# Patient Record
Sex: Female | Born: 1940 | Race: Black or African American | Hispanic: No | Marital: Married | Smoking: Never smoker
Health system: Southern US, Community
[De-identification: ages and names within clinical notes are randomized; demographics above are authoritative.]

## PROBLEM LIST (undated history)

## (undated) DIAGNOSIS — R945 Abnormal results of liver function studies: Secondary | ICD-10-CM

## (undated) DIAGNOSIS — K2970 Gastritis, unspecified, without bleeding: Secondary | ICD-10-CM

## (undated) DIAGNOSIS — E039 Hypothyroidism, unspecified: Secondary | ICD-10-CM

## (undated) DIAGNOSIS — B9681 Helicobacter pylori [H. pylori] as the cause of diseases classified elsewhere: Secondary | ICD-10-CM

## (undated) DIAGNOSIS — M1990 Unspecified osteoarthritis, unspecified site: Secondary | ICD-10-CM

## (undated) DIAGNOSIS — R7989 Other specified abnormal findings of blood chemistry: Secondary | ICD-10-CM

## (undated) DIAGNOSIS — E785 Hyperlipidemia, unspecified: Secondary | ICD-10-CM

## (undated) DIAGNOSIS — G4730 Sleep apnea, unspecified: Secondary | ICD-10-CM

## (undated) DIAGNOSIS — K589 Irritable bowel syndrome without diarrhea: Secondary | ICD-10-CM

## (undated) DIAGNOSIS — K449 Diaphragmatic hernia without obstruction or gangrene: Secondary | ICD-10-CM

## (undated) DIAGNOSIS — F0390 Unspecified dementia without behavioral disturbance: Secondary | ICD-10-CM

## (undated) DIAGNOSIS — G5600 Carpal tunnel syndrome, unspecified upper limb: Secondary | ICD-10-CM

## (undated) HISTORY — DX: Gastritis, unspecified, without bleeding: K29.70

## (undated) HISTORY — DX: Sleep apnea, unspecified: G47.30

## (undated) HISTORY — DX: Diaphragmatic hernia without obstruction or gangrene: K44.9

## (undated) HISTORY — DX: Abnormal results of liver function studies: R94.5

## (undated) HISTORY — DX: Unspecified dementia, unspecified severity, without behavioral disturbance, psychotic disturbance, mood disturbance, and anxiety: F03.90

## (undated) HISTORY — DX: Hyperlipidemia, unspecified: E78.5

## (undated) HISTORY — DX: Irritable bowel syndrome, unspecified: K58.9

## (undated) HISTORY — PX: THYROID SURGERY: SHX805

## (undated) HISTORY — DX: Hypothyroidism, unspecified: E03.9

## (undated) HISTORY — DX: Other specified abnormal findings of blood chemistry: R79.89

## (undated) HISTORY — DX: Unspecified osteoarthritis, unspecified site: M19.90

## (undated) HISTORY — PX: TONSILLECTOMY: SHX5217

## (undated) HISTORY — DX: Carpal tunnel syndrome, unspecified upper limb: G56.00

## (undated) HISTORY — DX: Helicobacter pylori (H. pylori) as the cause of diseases classified elsewhere: B96.81

## (undated) HISTORY — PX: ABDOMINAL HYSTERECTOMY: SHX81

## (undated) HISTORY — PX: BREAST LUMPECTOMY: SHX2

## (undated) HISTORY — PX: WRIST GANGLION EXCISION: SHX840

## (undated) HISTORY — PX: OTHER SURGICAL HISTORY: SHX169

---

## 1992-10-26 HISTORY — PX: CARPAL TUNNEL RELEASE: SHX101

## 2009-08-14 ENCOUNTER — Ambulatory Visit: Payer: Self-pay | Admitting: Family Medicine

## 2009-08-14 ENCOUNTER — Ambulatory Visit: Payer: Self-pay | Admitting: Obstetrics & Gynecology

## 2009-08-14 DIAGNOSIS — F0390 Unspecified dementia without behavioral disturbance: Secondary | ICD-10-CM

## 2009-08-14 DIAGNOSIS — R109 Unspecified abdominal pain: Secondary | ICD-10-CM | POA: Insufficient documentation

## 2009-08-14 DIAGNOSIS — J45909 Unspecified asthma, uncomplicated: Secondary | ICD-10-CM | POA: Insufficient documentation

## 2009-08-14 DIAGNOSIS — K5900 Constipation, unspecified: Secondary | ICD-10-CM | POA: Insufficient documentation

## 2009-08-15 ENCOUNTER — Encounter: Payer: Self-pay | Admitting: Family Medicine

## 2009-08-15 ENCOUNTER — Encounter: Payer: Self-pay | Admitting: Obstetrics & Gynecology

## 2009-08-31 ENCOUNTER — Ambulatory Visit: Payer: Self-pay | Admitting: Diagnostic Radiology

## 2009-08-31 ENCOUNTER — Emergency Department (HOSPITAL_BASED_OUTPATIENT_CLINIC_OR_DEPARTMENT_OTHER): Admission: EM | Admit: 2009-08-31 | Discharge: 2009-08-31 | Payer: Self-pay | Admitting: Emergency Medicine

## 2009-11-25 ENCOUNTER — Ambulatory Visit: Payer: Self-pay | Admitting: Radiology

## 2009-11-25 ENCOUNTER — Emergency Department (HOSPITAL_BASED_OUTPATIENT_CLINIC_OR_DEPARTMENT_OTHER): Admission: EM | Admit: 2009-11-25 | Discharge: 2009-11-25 | Payer: Self-pay | Admitting: Emergency Medicine

## 2010-02-13 ENCOUNTER — Inpatient Hospital Stay (HOSPITAL_COMMUNITY): Admission: AD | Admit: 2010-02-13 | Discharge: 2010-02-13 | Payer: Self-pay | Admitting: Obstetrics & Gynecology

## 2010-06-26 HISTORY — PX: REPLACEMENT TOTAL KNEE: SUR1224

## 2010-11-24 ENCOUNTER — Encounter (INDEPENDENT_AMBULATORY_CARE_PROVIDER_SITE_OTHER): Payer: Self-pay | Admitting: *Deleted

## 2010-12-03 NOTE — Letter (Signed)
Summary: New Patient letter  [facility]  [address] [place], [place] [address]   Phone: [phone]  Fax: [phone]       11/24/2010 MRN: [id number]  [facility] [address] [place], [place]  [address]  Dear Ms. Palinkas,  Welcome to the Gastroenterology Division at [facility].    You are scheduled to see Dr.  Leone Payor on 12-31-10 at 2:00P.M. on the 3rd floor at [facility], [address]  We ask that you try to arrive at our office 15 minutes prior to your appointment time to allow for check-in.  We would like you to complete the enclosed self-administered evaluation form prior to your visit and bring it with you on the day of your appointment.  We will review it with you.  Also, please bring a complete list of all your medications or, if you prefer, bring the medication bottles and we will list them.  Please bring your insurance card so that we may make a copy of it.  If your insurance requires a referral to see a specialist, please bring your referral form from your primary care physician.  Co-payments are due at the time of your visit and may be paid by cash, check or credit card.     Your office visit will consist of a consult with your physician (includes a physical exam), any laboratory testing he/she may order, scheduling of any necessary diagnostic testing (e.g. x-ray, ultrasound, CT-scan), and scheduling of a procedure (e.g. Endoscopy, Colonoscopy) if required.  Please allow enough time on your schedule to allow for any/all of these possibilities.    If you cannot keep your appointment, please call [phone] to cancel or reschedule prior to your appointment date.  This allows [place] the opportunity to schedule an appointment for another patient in need of care.  If you do not cancel or reschedule by 5 p.m. the business day prior to your appointment date, you will be charged a $50.00 late cancellation/no-show fee.    Thank you for choosing   Gastroenterology for your medical needs.  We appreciate the opportunity to care for you.  Please visit [place] at our website  to learn more about our practice.                     Sincerely,                                                             The Gastroenterology Division

## 2010-12-09 ENCOUNTER — Emergency Department (HOSPITAL_BASED_OUTPATIENT_CLINIC_OR_DEPARTMENT_OTHER)
Admission: EM | Admit: 2010-12-09 | Discharge: 2010-12-09 | Disposition: A | Discharge status: Home / Self Care | Payer: Medicare PPO | Attending: Emergency Medicine | Admitting: Emergency Medicine

## 2010-12-09 DIAGNOSIS — E039 Hypothyroidism, unspecified: Secondary | ICD-10-CM | POA: Insufficient documentation

## 2010-12-09 DIAGNOSIS — Z79899 Other long term (current) drug therapy: Secondary | ICD-10-CM | POA: Insufficient documentation

## 2010-12-09 DIAGNOSIS — G4730 Sleep apnea, unspecified: Secondary | ICD-10-CM | POA: Insufficient documentation

## 2010-12-09 DIAGNOSIS — R112 Nausea with vomiting, unspecified: Secondary | ICD-10-CM | POA: Insufficient documentation

## 2010-12-09 DIAGNOSIS — F0390 Unspecified dementia without behavioral disturbance: Secondary | ICD-10-CM | POA: Insufficient documentation

## 2010-12-09 DIAGNOSIS — R197 Diarrhea, unspecified: Secondary | ICD-10-CM | POA: Insufficient documentation

## 2010-12-09 DIAGNOSIS — E7800 Pure hypercholesterolemia, unspecified: Secondary | ICD-10-CM | POA: Insufficient documentation

## 2010-12-09 DIAGNOSIS — I10 Essential (primary) hypertension: Secondary | ICD-10-CM | POA: Insufficient documentation

## 2010-12-09 DIAGNOSIS — J45909 Unspecified asthma, uncomplicated: Secondary | ICD-10-CM | POA: Insufficient documentation

## 2010-12-09 LAB — COMPREHENSIVE METABOLIC PANEL
ALT: 43 U/L — ABNORMAL HIGH (ref 0–35)
AST: 48 U/L — ABNORMAL HIGH (ref 0–37)
CO2: 28 mEq/L (ref 19–32)
Chloride: 107 mEq/L (ref 96–112)
GFR calc Af Amer: >60 mL/min (ref >60)
GFR calc non Af Amer: >60 mL/min (ref >60)
Glucose, Bld: 85 mg/dL (ref 70–99)
Sodium: 147 mEq/L — ABNORMAL HIGH (ref 135–145)
Total Bilirubin: 1.2 mg/dL (ref 0.3–1.2)

## 2010-12-09 LAB — URINALYSIS, ROUTINE W REFLEX MICROSCOPIC
Bilirubin Urine: NEGATIVE
Hgb urine dipstick: NEGATIVE
Specific Gravity, Urine: 1.036 — ABNORMAL HIGH (ref 1.005–1.030)
Urine Glucose, Fasting: NEGATIVE mg/dL
Urobilinogen, UA: 1 mg/dL (ref 0.0–1.0)
pH: 5.5 (ref 5.0–8.0)

## 2010-12-09 LAB — DIFFERENTIAL
Basophils Relative: 1 % (ref 0–1)
Lymphocytes Relative: 21 % (ref 12–46)
Monocytes Absolute: 0.7 10*3/uL (ref 0.1–1.0)
Monocytes Relative: 11 % (ref 3–12)
Neutro Abs: 4.2 10*3/uL (ref 1.7–7.7)
Neutrophils Relative %: 66 % (ref 43–77)

## 2010-12-09 LAB — CBC
HCT: 36.5 % (ref 36.0–46.0)
Hemoglobin: 11.9 g/dL — ABNORMAL LOW (ref 12.0–15.0)
MCH: 27.9 pg (ref 26.0–34.0)
MCHC: 32.6 g/dL (ref 30.0–36.0)
RBC: 4.26 MIL/uL (ref 3.87–5.11)

## 2010-12-09 LAB — LIPASE, BLOOD: Lipase: 193 U/L (ref 23–300)

## 2010-12-18 ENCOUNTER — Emergency Department (HOSPITAL_COMMUNITY)
Admission: EM | Admit: 2010-12-18 | Discharge: 2010-12-18 | Disposition: A | Discharge status: Home / Self Care | Payer: Medicare PPO | Attending: Emergency Medicine | Admitting: Emergency Medicine

## 2010-12-18 ENCOUNTER — Encounter (INDEPENDENT_AMBULATORY_CARE_PROVIDER_SITE_OTHER): Payer: Self-pay | Admitting: *Deleted

## 2010-12-18 ENCOUNTER — Emergency Department (HOSPITAL_COMMUNITY): Payer: Medicare PPO

## 2010-12-18 DIAGNOSIS — F0390 Unspecified dementia without behavioral disturbance: Secondary | ICD-10-CM | POA: Insufficient documentation

## 2010-12-18 DIAGNOSIS — E039 Hypothyroidism, unspecified: Secondary | ICD-10-CM | POA: Insufficient documentation

## 2010-12-18 DIAGNOSIS — R109 Unspecified abdominal pain: Secondary | ICD-10-CM | POA: Insufficient documentation

## 2010-12-18 DIAGNOSIS — K589 Irritable bowel syndrome without diarrhea: Secondary | ICD-10-CM | POA: Insufficient documentation

## 2010-12-18 LAB — COMPREHENSIVE METABOLIC PANEL
ALT: 27 U/L (ref 0–35)
AST: 46 U/L — ABNORMAL HIGH (ref 0–37)
Albumin: 4.4 g/dL (ref 3.5–5.2)
Calcium: 10.1 mg/dL (ref 8.4–10.5)
GFR calc Af Amer: >60 mL/min (ref >60)
Glucose, Bld: 77 mg/dL (ref 70–99)
Sodium: 140 mEq/L (ref 135–145)
Total Protein: 7.5 g/dL (ref 6.0–8.3)

## 2010-12-18 LAB — DIFFERENTIAL
Basophils Absolute: 0 10*3/uL (ref 0.0–0.1)
Basophils Relative: 0 % (ref 0–1)
Eosinophils Absolute: 0.2 10*3/uL (ref 0.0–0.7)
Eosinophils Relative: 2 % (ref 0–5)
Monocytes Absolute: 0.3 10*3/uL (ref 0.1–1.0)

## 2010-12-18 LAB — URINALYSIS, ROUTINE W REFLEX MICROSCOPIC
Hgb urine dipstick: NEGATIVE
Ketones, ur: NEGATIVE mg/dL
Urine Glucose, Fasting: NEGATIVE mg/dL
pH: 6 (ref 5.0–8.0)

## 2010-12-18 LAB — CBC
MCHC: 31.1 g/dL (ref 30.0–36.0)
Platelets: 280 10*3/uL (ref 150–400)
RDW: 15 % (ref 11.5–15.5)
WBC: 6.6 10*3/uL (ref 4.0–10.5)

## 2010-12-18 MED ORDER — IOHEXOL 300 MG/ML  SOLN: 100.0000 mL | Freq: Once | INTRAMUSCULAR | Status: DC | PRN

## 2010-12-31 ENCOUNTER — Encounter: Payer: Self-pay | Admitting: Internal Medicine

## 2010-12-31 ENCOUNTER — Ambulatory Visit (INDEPENDENT_AMBULATORY_CARE_PROVIDER_SITE_OTHER): Payer: Medicare PPO | Admitting: Internal Medicine

## 2010-12-31 ENCOUNTER — Encounter (INDEPENDENT_AMBULATORY_CARE_PROVIDER_SITE_OTHER): Payer: Self-pay | Admitting: *Deleted

## 2010-12-31 DIAGNOSIS — R109 Unspecified abdominal pain: Secondary | ICD-10-CM | POA: Insufficient documentation

## 2010-12-31 DIAGNOSIS — R634 Abnormal weight loss: Secondary | ICD-10-CM | POA: Insufficient documentation

## 2010-12-31 DIAGNOSIS — R112 Nausea with vomiting, unspecified: Secondary | ICD-10-CM | POA: Insufficient documentation

## 2011-01-01 ENCOUNTER — Encounter: Payer: Self-pay | Admitting: Internal Medicine

## 2011-01-01 ENCOUNTER — Other Ambulatory Visit: Payer: Self-pay | Admitting: Internal Medicine

## 2011-01-01 ENCOUNTER — Other Ambulatory Visit: Payer: Medicare PPO

## 2011-01-01 ENCOUNTER — Other Ambulatory Visit (AMBULATORY_SURGERY_CENTER): Payer: Medicare PPO | Admitting: Internal Medicine

## 2011-01-01 DIAGNOSIS — R740 Nonspecific elevation of levels of transaminase and lactic acid dehydrogenase [LDH]: Secondary | ICD-10-CM

## 2011-01-01 DIAGNOSIS — R112 Nausea with vomiting, unspecified: Secondary | ICD-10-CM

## 2011-01-01 DIAGNOSIS — A048 Other specified bacterial intestinal infections: Secondary | ICD-10-CM

## 2011-01-01 DIAGNOSIS — K2940 Chronic atrophic gastritis without bleeding: Secondary | ICD-10-CM

## 2011-01-01 DIAGNOSIS — R634 Abnormal weight loss: Secondary | ICD-10-CM

## 2011-01-01 DIAGNOSIS — R933 Abnormal findings on diagnostic imaging of other parts of digestive tract: Secondary | ICD-10-CM

## 2011-01-01 DIAGNOSIS — R109 Unspecified abdominal pain: Secondary | ICD-10-CM

## 2011-01-01 LAB — HEPATIC FUNCTION PANEL
AST: 39 U/L — ABNORMAL HIGH (ref 0–37)
Alkaline Phosphatase: 78 U/L (ref 39–117)
Total Protein: 6.8 g/dL (ref 6.0–8.3)

## 2011-01-05 ENCOUNTER — Encounter: Payer: Self-pay | Admitting: Internal Medicine

## 2011-01-06 ENCOUNTER — Telehealth: Payer: Self-pay | Admitting: Internal Medicine

## 2011-01-06 NOTE — Miscellaneous (Signed)
Summary: Orders Update  Clinical Lists Changes  Problems: Added new problem of TRANSAMINASES, SERUM, ELEVATED (ICD-790.4) Orders: Added new Test order of TLB-Hepatic/Liver Function Pnl (80076-HEPATIC) - Signed  Appended Document: Orders Update    Clinical Lists Changes  Orders: Added new Test order of TLB-Hepatic/Liver Function Pnl (80076-HEPATIC) - Signed

## 2011-01-06 NOTE — Assessment & Plan Note (Signed)
Summary: DIARRHEA//SCH'D W/PT//MEDLIST//CX POLICY ADVISED//GI HX 10+ Y...   History of Present Illness Visit Type: Initial Visit Primary GI MD: Stan Head MD [facility] Primary Provider: Tracey Harries, MD Chief Complaint: Abdominal pain. History of Present Illness:   Patients husband states that Kelsey Norris has generalized abdominal pain and vomiting which is not constant. She has been seen recently in the ER, with out an answer to the abdominal pain. She was having diarrhea but it resolved. These symptoms started about 4-5 weeks ago.   Husband provides most of hx due to her dementia. She started with diarrhea and nausea and vomiting. On lactulose for years and that treats constipation. Stools better but not all the way back to where they were.  Now she has continued to have abdominal pain and nuasea and vomiting. It is generally post-prandial, has vomited a couple of tinmes in past week. Partially digested food. she has had 2 ED visits and a CT, labs alk phos elevated. Treated with hydrocodone. Cardiologist stopped metoprolol and losartan which helped improve asthma. enablex is new in last 2 months has held x 4 days and no different. EGD 2009 esophagitis, gastritis ([place]) colonoscopy > 10 years ago ook   GI Review of Systems    Reports abdominal pain, vomiting, and  weight loss.     Location of  Abdominal pain: generalized. Weight loss of 20+ pounds over 1 year.   Denies acid reflux, belching, bloating, chest pain, dysphagia with liquids, dysphagia with solids, heartburn, loss of appetite, nausea, vomiting blood, and  weight gain.        Denies anal fissure, black tarry stools, change in bowel habit, constipation, diarrhea, diverticulosis, fecal incontinence, heme positive stool, hemorrhoids, irritable bowel syndrome, jaundice, light color stool, liver problems, rectal bleeding, and  rectal pain.    Current Medications (verified): 1)  Omeprazole 20 Mg Cpdr (Omeprazole) .... Take One By Mouth  Once Daily 2)  Levoxyl 88 Mcg Tabs (Levothyroxine Sodium) .... Take One By Mouth Once Daily 3)  Advair Hfa 230-21 Mcg/act Aero (Fluticasone-Salmeterol) .... Use Two Times A Day 4)  Singulair 10 Mg Tabs (Montelukast Sodium) .... Take One By Mouth Once Daily 5)  Aricept 5 Mg Tabs (Donepezil Hcl) .... Take One By Mouth Once Daily 6)  Aspirin 81 Mg Chew (Aspirin) .... Take One By Mouth Once Daily 7)  Vitamin B-12 100 Mcg Tabs (Cyanocobalamin) .... Take One By Mouth Once Daily 8)  Generlac 10 Gm/35ml Soln (Lactulose Encephalopathy) .... 2 Tbs By Mouth Twice A Day 9)  Folbic 2.5-25-2 Mg Tabs (Fa-Pyridoxine-Cyancobalamin) .... Take One By Mouth Once Daily 10)  Multivitamins  Tabs (Multiple Vitamin) .... Take One By Mouth Once Daily 11)  Vitamin D 2000 Unit Tabs (Cholecalciferol) .... Take One By Mouth Once Daily 12)  Osteo Bi-Flex Regular Strength 250-200 Mg Tabs (Glucosamine-Chondroitin) .... Take Four By Mouth Once Daily 13)  Pravachol 40 Mg Tabs (Pravastatin Sodium) .... Take One By Mouth Once Daily 14)  Potassium Gluconate 595 Mg Tabs (Potassium Gluconate) .... Take One By Mouth Once Daily 15)  Enablex 7.5 Mg Xr24h-Tab (Darifenacin Hydrobromide) .... Take One By Mouth Once Daily 16)  Vitamin C 1000 Mg Tabs (Ascorbic Acid) .... Take One By Mouth Once Daily 17)  Hydrocodone-Acetaminophen 5-325 Mg Tabs (Hydrocodone-Acetaminophen) .... Take One By Mouth Every 4-6 Hours 18)  Cvs Non-Aspirin Extra Strength 500 Mg Tabs (Acetaminophen) .... Take 2 Capsules Every 4-6 Hours  Allergies (verified): No Known Drug Allergies  Past History:  Past Medical History: Asthma  Dementia Hyperlipidemia Hypothyroidism Sleep Apnea Arthritis Irritable Bowel Syndrome  Past Surgical History: Ear Lymphectomy Heart Cath Carpal tunnel release bilat 1994 Hysterectomy Tonsillectomy Thyroid Surgery Ganglion cyst removed left wrist Lumpectomy left breast right knee replacement 06/2010  Family History: No FH of  Colon Cancer: Family History of Diabetes: sister  Social History: Non-smoker No ETOH No Drugs Retired Tree surgeon Daily Caffeine Use one per week  Review of Systems       The Kelsey Norris complains of arthritis/joint pain, back pain, headaches-new, urination - excessive, and urine leakage.         All other ROS negative except as per HPI.   Vital Signs:  Kelsey Norris profile:   70 year old female Height:      61 inches Weight:      137.2 pounds BMI:     26.02 Pulse rate:   74 / minute Pulse rhythm:   regular BP sitting:   124 / 68  (left arm) Cuff size:   regular  Vitals Entered By: Harlow Mares CMA Duncan Dull) (December 31, 2010 2:40 PM)  Physical Exam  General:  Well developed, well nourished, no acute distress. Eyes:  bilat arcus PEERLA anicteric Mouth:  dentures otherwise clear Neck:  Supple; no masses or thyromegaly. Lungs:  coarse breath sounds fair to good air mvt Heart:  Regular rate and rhythm; no murmurs, rubs,  or bruits. Abdomen:  soft and nontender BS+ no bruits no mass, hernia no HSM Extremities:  no LE edema Cervical Nodes:  No significant cervical or supraclavicular adenopathy.  Psych:  appropriate affect mental status nt tested   Impression & Recommendations:  Problem # 1:  ABDOMINAL PAIN, UPPER (ICD-789.09) Assessment New Several week history. A bit hard to sort out with dementia. initially nausea, vomiting and diarrhea, too. ? functional. She has lost weiht but that can happen with dementia. CT abd/pelvis unrevealing as are labs except for mild increases in alk phos, transaminases. Does still have gallbladder. She also has a hx of IBS. Note that medical hx husband brings shows prior normal HIDA after abnormal gallbladder xray in 1990's, US liver 1998, and problems with weight loss and dysphagia in past also (around tme of EGd with esophagitis and gastritis 2009. will start with EGD to look for possible causes there. If unrevealing then an abdominal  ultrasound next, I think. Risks, benefits,and indications of endoscopic procedure(s) were reviewed with the Kelsey Norris and all questions answered.  Problem # 2:  NAUSEA AND VOMITING (ICD-787.01) Assessment: New Not much vomitng now.  Orders: EGD (EGD)  Problem # 3:  WEIGHT LOSS (ICD-783.21) Assessment: New ? how much from dementia vs. GI sxs  Kelsey Norris Instructions: 1)  [facility] Kelsey Norris Information Guide given to Kelsey Norris.  2)  Upper Endoscopy brochure given.  3)  Your procedure has been scheduled for 01/01/2011, please follow the seperate instructions.  4)  The medication list was reviewed and reconciled.  All changed / newly prescribed medications were explained.  A complete medication list was provided to the Kelsey Norris / caregiver. cc: Dr. Everlene Other

## 2011-01-06 NOTE — Procedures (Addendum)
Summary: Upper Endoscopy  Patient: Kelsey Norris Note: All result statuses are Final unless otherwise noted.  Tests: (1) Upper Endoscopy (EGD)   EGD Upper Endoscopy       DONE     [facility]     [address]     [place], [place]  [address]          ENDOSCOPY PROCEDURE REPORT          PATIENT:  Kelsey Norris, Kelsey Norris  MR#:  [id number]     BIRTHDATE:  Feb 18, 1941, 69 yrs. old  GENDER:  female          ENDOSCOPIST:  Iva Boop, MD, [facility]     Referred by:  Tracey Harries, M.D.          PROCEDURE DATE:  01/01/2011     PROCEDURE:  EGD with biopsy, [address]     ASA CLASS:  Class II     INDICATIONS:  nausea and vomiting, abdominal pain, weight loss          MEDICATIONS:   Fentanyl 25 mcg IV, Versed 3 mg IV     TOPICAL ANESTHETIC:  Exactacain Spray          DESCRIPTION OF PROCEDURE:   After the risks benefits and     alternatives of the procedure were thoroughly explained, informed     consent was obtained.  The [facility] [id number] endoscope was     introduced through the mouth and advanced to the second portion of     the duodenum, without limitations.  The instrument was slowly     withdrawn as the mucosa was fully examined.     <<PROCEDUREIMAGES>>          Abnormal appearing mucosa in the fundus. Red spots seen and     slightly irregular mucosa. ? gastritis. Multiple biopsies were     obtained and sent to pathology.  Otherwise the examination was     normal. There was some bluish discoloration in one side of distal     esophagus that is non-specific, ? prominent venous structures but     not a varix.    Retroflexed views revealed Retroflexion exam     demonstrated findings as previously described.    The scope was     then withdrawn from the patient and the procedure completed.          COMPLICATIONS:  None          ENDOSCOPIC IMPRESSION:     1) Abnormal mucosa in the fundus, ? gastritis - biopsied     2) Otherwise normal examination     RECOMMENDATIONS:     1)  Await pathology results     2) Hepatic function panel today - office has ordered labs.     3) Consider [place] depending upon bx results and labs. she has a     long history of functional GI complaints, multiple medications and     dementia that create multiple possible causes of her problems.          Iva Boop, MD, [facility]          CC:  Tracey Harries, MD and The Patient          n.     eSIGNED:   Iva Boop at 01/01/2011 12:08 PM          Licht, [place], [phone]  Note: An exclamation mark (!) indicates a result that was not dispersed  into the flowsheet. Document Creation Date: 01/01/2011 12:08 PM _______________________________________________________________________  (1) Order result status: Final Collection or observation date-time: 01/01/2011 11:55 Requested date-time:  Receipt date-time:  Reported date-time:  Referring Physician:   Ordering Physician: Stan Head [phone]) Specimen Source:  Source: Launa Grill Order Number: [id number] Lab site:

## 2011-01-06 NOTE — Letter (Signed)
Summary: EGD Instructions  [facility]  [address] [place], [place] [address]   Phone: [phone]  Fax: [phone]       [facility]    [birth year]    MRN: [id number]       Procedure Day /Date: Thursday 01/01/2011     Arrival Time:  9:30am     Procedure Time: 10:30am     Location of Procedure:                    [facility] (4th Floor)   PREPARATION FOR ENDOSCOPY   On 01/01/2011 THE DAY OF THE PROCEDURE:  1.   No solid foods, milk or milk products are allowed after midnight the night before your procedure.  2.   Do not drink anything colored red or purple.  Avoid juices with pulp.  No orange juice.  3.  You may drink clear liquids until 8:30am, which is 2 hours before your procedure.                                                                                                CLEAR LIQUIDS INCLUDE: Water Jello Ice Popsicles Tea (sugar ok, no milk/cream) Powdered fruit flavored drinks Coffee (sugar ok, no milk/cream) Gatorade Juice: apple, white grape, white cranberry  Lemonade Clear bullion, consomm, broth Carbonated beverages (any kind) Strained chicken noodle soup Hard Candy   MEDICATION INSTRUCTIONS  Unless otherwise instructed, you should take regular prescription medications with a small sip of water as early as possible the morning of your procedure.                 OTHER INSTRUCTIONS  You will need a responsible adult at least 71 years of age to accompany you and drive you home.   This person must remain in the waiting room during your procedure.  Wear loose fitting clothing that is easily removed.  Leave jewelry and other valuables at home.  However, you may wish to bring a book to read or an iPod/MP3 player to listen to music as you wait for your procedure to start.  Remove all body piercing jewelry and leave at home.  Total time from sign-in until discharge is approximately 2-3 hours.  You should go home  directly after your procedure and rest.  You can resume normal activities the day after your procedure.  The day of your procedure you should not:   Drive   Make legal decisions   Operate machinery   Drink alcohol   Return to work  You will receive specific instructions about eating, activities and medications before you leave.    The above instructions have been reviewed and explained to me by   _______________________    I fully understand and can verbalize these instructions _____________________________ Date _________

## 2011-01-08 ENCOUNTER — Telehealth: Payer: Self-pay

## 2011-01-12 LAB — CBC
MCHC: 33.8 g/dL (ref 30.0–36.0)
MCV: 91.5 fL (ref 78.0–100.0)
Platelets: 189 10*3/uL (ref 150–400)
RBC: 3.88 MIL/uL (ref 3.87–5.11)
RDW: 13.5 % (ref 11.5–15.5)

## 2011-01-12 LAB — URINALYSIS, ROUTINE W REFLEX MICROSCOPIC
Hgb urine dipstick: NEGATIVE
Protein, ur: NEGATIVE mg/dL
Specific Gravity, Urine: 1.017 (ref 1.005–1.030)
Urobilinogen, UA: 0.2 mg/dL (ref 0.0–1.0)

## 2011-01-12 LAB — COMPREHENSIVE METABOLIC PANEL
ALT: 14 U/L (ref 0–35)
AST: 45 U/L — ABNORMAL HIGH (ref 0–37)
Albumin: 4.5 g/dL (ref 3.5–5.2)
CO2: 28 mEq/L (ref 19–32)
Calcium: 9.3 mg/dL (ref 8.4–10.5)
Creatinine, Ser: 1.2 mg/dL (ref 0.4–1.2)
GFR calc Af Amer: 54 mL/min — ABNORMAL LOW (ref >60)
GFR calc non Af Amer: 45 mL/min — ABNORMAL LOW (ref >60)
Sodium: 141 mEq/L (ref 135–145)
Total Protein: 7.4 g/dL (ref 6.0–8.3)

## 2011-01-12 LAB — DIFFERENTIAL
Eosinophils Absolute: 0.2 10*3/uL (ref 0.0–0.7)
Eosinophils Relative: 3 % (ref 0–5)
Lymphocytes Relative: 28 % (ref 12–46)
Lymphs Abs: 1.8 10*3/uL (ref 0.7–4.0)
Monocytes Relative: 8 % (ref 3–12)

## 2011-01-12 LAB — URINE CULTURE
Colony Count: NO GROWTH
Culture: NO GROWTH

## 2011-01-12 LAB — LIPASE, BLOOD: Lipase: 123 U/L (ref 23–300)

## 2011-01-12 LAB — URINE MICROSCOPIC-ADD ON

## 2011-01-13 LAB — CBC
HCT: 34.3 % — ABNORMAL LOW (ref 36.0–46.0)
Hemoglobin: 11.5 g/dL — ABNORMAL LOW (ref 12.0–15.0)
MCV: 93.2 fL (ref 78.0–100.0)
Platelets: 186 10*3/uL (ref 150–400)
RBC: 3.68 MIL/uL — ABNORMAL LOW (ref 3.87–5.11)
WBC: 4.5 10*3/uL (ref 4.0–10.5)

## 2011-01-13 LAB — URINALYSIS, ROUTINE W REFLEX MICROSCOPIC
Glucose, UA: NEGATIVE mg/dL
Hgb urine dipstick: NEGATIVE
Protein, ur: NEGATIVE mg/dL
Specific Gravity, Urine: <1.005 — ABNORMAL LOW (ref 1.005–1.030)
pH: 6 (ref 5.0–8.0)

## 2011-01-13 NOTE — Progress Notes (Signed)
Summary: H pylori + Pylera rxed   Phone Note Outgoing Call   Summary of Call: Call husband and let him know she has H. pylori gastritis. this could be the cause of her problems. i rxed Pylera (was on formulary) and she should take Prilosec OTC 20 mg in afternoons for 10 days along with this (to get two times a day PPI)  If this fails to help let us know  Iva Boop MD, [facility]  January 06, 2011 3:23 PM   Follow-up for Phone Call        Notified patient's husband that Dr Leone Payor ordered a med for her H. Pylori and hopefully that's what's wrong with her stomach. I instructed him to have her take Prilosec as well, an extra dose in the afternoon while on the Pylera. He stated understanding and will call for problems. Follow-up by: Graciella Freer RN,  January 06, 2011 4:22 PM    New/Updated Medications: PYLERA 140-125-125 MG CAPS (BIS SUBCIT-METRONID-TETRACYC) 3 caps by mouth four times a day x 10 days OMEPRAZOLE 20 MG  CPDR (OMEPRAZOLE) 1 each day 30 minutes before am meal AND in afternoon x 10days while taking Pylera Prescriptions: OMEPRAZOLE 20 MG  CPDR (OMEPRAZOLE) 1 each day 30 minutes before am meal AND in afternoon x 10days while taking Pylera  #20 x 0   Entered by:   Graciella Freer RN   Authorized by:   Iva Boop MD, [facility]   Signed by:   Graciella Freer RN on 01/06/2011   Method used:   Electronically to        [facility] [phone]* (retail)       [address]       [place], [place]  [address]       Ph: [phone]       Fax: [phone]   RxID:   [id number] PYLERA [phone] MG CAPS (BIS SUBCIT-METRONID-TETRACYC) 3 caps by mouth four times a day x 10 days  #120 x 0   Entered and Authorized by:   Iva Boop MD, [facility]   Signed by:   Iva Boop MD, FACG on 01/06/2011   Method used:   [facility]ically [facility]        [facility] [phone]* (retail)       [address]       [place], [place]  [address]  Ph: [phone]       Fax: [phone]   RxID:   [id number]

## 2011-01-13 NOTE — Progress Notes (Signed)
Summary: Liver test results  Phone Note Call from Patient Call back at Home Phone [phone]   Call For: Dr Leone Payor Reason for Call: Lab or Test Results Initial call taken by: Leanor Kail [facility],  January 08, 2011 1:52 PM  Follow-up for Phone Call        Patient notified of laboratory results Follow-up by: Darcey Nora RN, CGRN,  January 08, 2011 2:23 PM

## 2011-01-14 ENCOUNTER — Telehealth: Payer: Self-pay | Admitting: Internal Medicine

## 2011-01-14 DIAGNOSIS — R1013 Epigastric pain: Secondary | ICD-10-CM

## 2011-01-14 NOTE — Telephone Encounter (Signed)
Pt was + for H.Pylori and was placed on Pylera and Prilosec bid on 01/06/11. He reports pt is still c/o abdominal pain even with the Hydrocodone for she takes for Arthritis. The pain is in the center of her stomach and he noticed this am her stools are black and lumpy. Pt ran out of Prilosec last pm, but he will buy more OTC. Please advise.

## 2011-01-14 NOTE — Telephone Encounter (Signed)
Pt is scheduled for an abdominal US at [facility] for 01/19/11 11:30.  Pt is advised to arrive at radiology at 11:15 and to be NPO for 6 hours prior.  Patient's husband is aware.

## 2011-01-14 NOTE — Telephone Encounter (Signed)
She needs an abdominal ultrasound to evaluate epigastric pain

## 2011-01-19 ENCOUNTER — Ambulatory Visit (HOSPITAL_COMMUNITY)
Admission: RE | Admit: 2011-01-19 | Discharge: 2011-01-19 | Disposition: A | Discharge status: Home / Self Care | Payer: Medicare PPO | Source: Ambulatory Visit | Attending: Internal Medicine | Admitting: Internal Medicine

## 2011-01-19 DIAGNOSIS — R1013 Epigastric pain: Secondary | ICD-10-CM

## 2011-02-20 ENCOUNTER — Encounter: Payer: Self-pay | Admitting: *Deleted

## 2011-02-25 ENCOUNTER — Ambulatory Visit (INDEPENDENT_AMBULATORY_CARE_PROVIDER_SITE_OTHER): Payer: Medicare PPO | Admitting: Internal Medicine

## 2011-02-25 ENCOUNTER — Encounter: Payer: Self-pay | Admitting: Internal Medicine

## 2011-02-25 VITALS — BP 144/80 | HR 72 | Ht 61.0 in | Wt 136.0 lb

## 2011-02-25 DIAGNOSIS — B9681 Helicobacter pylori [H. pylori] as the cause of diseases classified elsewhere: Secondary | ICD-10-CM

## 2011-02-25 DIAGNOSIS — K5909 Other constipation: Secondary | ICD-10-CM

## 2011-02-25 DIAGNOSIS — R634 Abnormal weight loss: Secondary | ICD-10-CM

## 2011-02-25 DIAGNOSIS — R1013 Epigastric pain: Secondary | ICD-10-CM

## 2011-02-25 DIAGNOSIS — K2970 Gastritis, unspecified, without bleeding: Secondary | ICD-10-CM | POA: Insufficient documentation

## 2011-02-25 DIAGNOSIS — K5900 Constipation, unspecified: Secondary | ICD-10-CM

## 2011-02-25 DIAGNOSIS — A048 Other specified bacterial intestinal infections: Secondary | ICD-10-CM

## 2011-02-25 NOTE — Patient Instructions (Signed)
You may try taking MiraLax 1 dose twice a day instead of the lactulose, if desired. It may create less gas. If it does not work as well or you have ?'s, cal [place] back. A dose of MiraLax is 17 grams which is 1 capful or 1 tablespoon and mix in 6-8 oz of liquid. Mix for about 1 minute.

## 2011-02-25 NOTE — Progress Notes (Signed)
  Subjective:    Patient ID: Kelsey Norris, female    DOB: 26-Jul-1941, 70 y.o.   MRN: [id number]  HPI 49-year-old [place] American woman here for follow up of weight loss and abdominal pain. She also has constipation. Husband and patient provide history.  Her husband reports that she is doing very well. She was treated for H. pylori with B[place]. Since that time though there was some initial delay in improvement in her pain in the epigastrium is essentially gone, she is eating well with good appetite. She still has some occasional constipation. She is on lactulose. That worked pretty well but sometime she may have some gas and bloating episodes.  Her husband also reports he has discontinued any lemon juice or lemonade containing drinks. Her pravastatin was stopped as well as that was causing epigastric pain every time she ate. She said an ultrasound CT scan and EGD with the findings being H. pyloric gastritis only. She is continuing to follow up with Dr. Everlene Other.   Review of Systems     Objective:   Physical Exam Elderly black woman NAD       Assessment & Plan:  Weight loss, epigastric pain and constipation. She is significantly improved. She had H. pylori gastritis. I can only suppose that was the cause of the pravastatin may be in part of the problem as well. At any rate she is significantly improved I will see her back as needed. I have explained that she could try MiraLax and senna lactulose for constipation if desired and instructions were provided.  Cc: Dr. Everlene Other

## 2011-03-10 NOTE — Assessment & Plan Note (Signed)
NAME:  Pistole, Skyanne                ACCOUNT NO.:  [id number]   MEDICAL RECORD NO.:  [id number]          PATIENT TYPE:  POB   LOCATION:  [facility] [place]         FACILITY:  [facility]   PHYSICIAN:  Allie Bossier, MD        DATE OF BIRTH:  06-11-1941   DATE OF SERVICE:  08/14/2009                                  CLINIC NOTE   Ms. Cordone is a 70 year old, married black, gravida 4, para 2, abortus 2,  who is here because of increasing urinary frequency.  This has been  going on for relatively long time.  She has been seen by urologist in  [place], who placed her on Detrol.  She said this did not help, so her  urologist gave her oxybutynin.  She is currently on oxybutynin 10 mg at  night and she says this does not help either.  She denies dysuria.  She  denies stress incontinence symptoms.  She does wear pads daily because  she does have urinary accident and when she cannot make it to the  bathroom in time.  She is obese and has never been checked for diabetes  that she can recall, therefore I will do a capillary blood glucose  today.   On exam, she has moderate amount of atrophy.  She has no descensus.  She  has no stress incontinence with Valsalva in the lithotomy position.  I  did a cath UA and urine culture and her CBG will be done today as well.  I have told her that if her CBG is normal and her urinalysis is normal  then I would suggest a Urology referral at her convenience.      Allie Bossier, MD     MCD/MEDQ  D:  08/14/2009  T:  08/15/2009  Job:  [id number]

## 2011-09-15 ENCOUNTER — Emergency Department (HOSPITAL_BASED_OUTPATIENT_CLINIC_OR_DEPARTMENT_OTHER)
Admission: EM | Admit: 2011-09-15 | Discharge: 2011-09-15 | Disposition: A | Discharge status: Home / Self Care | Payer: Medicare PPO | Attending: Emergency Medicine | Admitting: Emergency Medicine

## 2011-09-15 ENCOUNTER — Encounter (HOSPITAL_BASED_OUTPATIENT_CLINIC_OR_DEPARTMENT_OTHER): Payer: Self-pay | Admitting: *Deleted

## 2011-09-15 DIAGNOSIS — J45909 Unspecified asthma, uncomplicated: Secondary | ICD-10-CM | POA: Insufficient documentation

## 2011-09-15 DIAGNOSIS — E079 Disorder of thyroid, unspecified: Secondary | ICD-10-CM | POA: Insufficient documentation

## 2011-09-15 DIAGNOSIS — Z79899 Other long term (current) drug therapy: Secondary | ICD-10-CM | POA: Insufficient documentation

## 2011-09-15 DIAGNOSIS — L538 Other specified erythematous conditions: Secondary | ICD-10-CM | POA: Insufficient documentation

## 2011-09-15 DIAGNOSIS — E785 Hyperlipidemia, unspecified: Secondary | ICD-10-CM | POA: Insufficient documentation

## 2011-09-15 DIAGNOSIS — F0390 Unspecified dementia without behavioral disturbance: Secondary | ICD-10-CM | POA: Insufficient documentation

## 2011-09-15 DIAGNOSIS — K589 Irritable bowel syndrome without diarrhea: Secondary | ICD-10-CM | POA: Insufficient documentation

## 2011-09-15 DIAGNOSIS — L304 Erythema intertrigo: Secondary | ICD-10-CM

## 2011-09-15 MED ORDER — CLOTRIMAZOLE 1 % EX CREA: TOPICAL_CREAM | CUTANEOUS | Status: DC

## 2011-09-15 MED ORDER — TRAMADOL HCL 50 MG PO TABS: 50.0000 mg | ORAL_TABLET | Freq: Four times a day (QID) | ORAL | Status: AC | PRN

## 2011-09-15 MED ORDER — CLOTRIMAZOLE 1 % EX CREA: TOPICAL_CREAM | CUTANEOUS | Status: AC

## 2011-09-15 NOTE — ED Notes (Signed)
Per family and patient, she has a rash in the area of her groin that has spread to her posterior, itches and noticed yesterday

## 2011-09-15 NOTE — ED Provider Notes (Signed)
History     CSN: [id number] Arrival date & time: 09/15/2011  9:04 AM   First MD Initiated Contact with Patient 09/15/11 0902      Chief Complaint  Patient presents with  . Rash    (Consider location/radiation/quality/duration/timing/severity/associated sxs/prior treatment) Patient is a 70 y.o. female presenting with rash. The history is provided by the patient and the spouse.  Rash  This is a new problem. The problem has been gradually worsening.   patient has had a painful rash in her left groin it goes from posterior. It began yesterday and has worsened today. No fevers. She has dementia, so history from her is difficult. Her husband does provide good history though. No drainage. It is painful with palpation or movement. She is incontinent and wears a brief normally.  Past Medical History  Diagnosis Date  . Asthma   . Dementia   . Hyperlipidemia   . Hypothyroidism   . Sleep apnea   . Arthritis   . IBS (irritable bowel syndrome)   . Helicobacter pylori gastritis   . Elevated LFTs   . Hiatal hernia   . Carpal tunnel syndrome     Past Surgical History  Procedure Date  . Replacement total knee     right  . Tonsillectomy   . Breast lumpectomy     left  . Wrist ganglion excision     Family History  Problem Relation Age of Onset  . Diabetes Sister     History  Substance Use Topics  . Smoking status: Never Smoker   . Smokeless tobacco: Never Used  . Alcohol Use: No    OB History    Grav Para Term Preterm Abortions TAB SAB Ect Mult Living                  Review of Systems  Skin: Positive for color change and rash. Negative for pallor.  Hematological: Negative for adenopathy.    Allergies  Review of patient's allergies indicates no known allergies.  Home Medications   Current Outpatient Rx  Name Route Sig Dispense Refill  . ACETAMINOPHEN 500 MG PO TABS Oral Take 1,000 mg by mouth every 6 (six) hours as needed.      . ASPIRIN 81 MG PO TABS Oral Take  81 mg by mouth daily.      Marland Kitchen VITAMIN D 2000 UNITS PO CAPS Oral Take 1 capsule by mouth daily.      Marland Kitchen CLOTRIMAZOLE 1 % EX CREA  Apply to affected area 2 times daily 15 g 0  . DARIFENACIN HYDROBROMIDE 7.5 MG PO TB24 Oral Take 7.5 mg by mouth daily.     . DONEPEZIL HCL 10 MG PO TABS Oral Take 10 mg by mouth at bedtime as needed.      Marland Kitchen FA-PYRIDOXINE-CYANCOBALAMIN 2.5-25-2 MG PO TABS Oral Take 1 tablet by mouth daily.      Marland Kitchen GLUCOSAMINE-CHONDROITIN 250-200 MG PO TABS Oral Take 4 tablets by mouth daily.      Marland Kitchen LACTULOSE ENCEPHALOPATHY 10 GM/15ML PO SOLN  2 TBS by mouth twice a day     . LEVOTHYROXINE SODIUM 88 MCG PO TABS Oral Take 88 mcg by mouth daily.      Marland Kitchen MONTELUKAST SODIUM 10 MG PO TABS Oral Take 10 mg by mouth daily.      Marland Kitchen ONE-DAILY MULTI VITAMINS PO TABS Oral Take 1 tablet by mouth daily.      Marland Kitchen OMEPRAZOLE 20 MG PO CPDR Oral Take 20 mg  by mouth daily.      . TRAMADOL HCL 50 MG PO TABS Oral Take 1 tablet (50 mg total) by mouth every 6 (six) hours as needed for pain. Maximum dose= 8 tablets per day 15 tablet 0  . VITAMIN B-12 1000 MCG PO TABS Oral Take 1,000 mcg by mouth daily.        BP 138/75  Pulse 60  Temp(Src) 97.9 F (36.6 C) (Oral)  Resp 18  SpO2 100%  Physical Exam  Vitals reviewed. Constitutional: She appears well-developed.  Neurological: She is alert.       Mild dementia.  Skin: Skin is warm. There is erythema.       Patient has erythema and bilateral inguinal fold area. No drainage. No discharge. No induration. There is mild tenderness to the area.    ED Course  Procedures (including critical care time)  Labs Reviewed - No data to display No results found.   1. Intertrigo       MDM  Patient has had a rash in her bilateral inguinal area. Likely intertrigo. She will be treated for a yeast cause for this point. Instructions were given to watch for secondary bacterial infection. She'll follow with her PCP as needed        Juliet Rude. Rubin Payor,  MD 09/15/11 [phone]

## 2011-09-16 ENCOUNTER — Encounter (HOSPITAL_BASED_OUTPATIENT_CLINIC_OR_DEPARTMENT_OTHER): Payer: Self-pay | Admitting: *Deleted

## 2011-09-16 ENCOUNTER — Emergency Department (HOSPITAL_BASED_OUTPATIENT_CLINIC_OR_DEPARTMENT_OTHER)
Admission: EM | Admit: 2011-09-16 | Discharge: 2011-09-16 | Disposition: A | Discharge status: Home / Self Care | Payer: Medicare PPO | Attending: Emergency Medicine | Admitting: Emergency Medicine

## 2011-09-16 DIAGNOSIS — J45909 Unspecified asthma, uncomplicated: Secondary | ICD-10-CM | POA: Insufficient documentation

## 2011-09-16 DIAGNOSIS — K589 Irritable bowel syndrome without diarrhea: Secondary | ICD-10-CM | POA: Insufficient documentation

## 2011-09-16 DIAGNOSIS — F0390 Unspecified dementia without behavioral disturbance: Secondary | ICD-10-CM | POA: Insufficient documentation

## 2011-09-16 DIAGNOSIS — E785 Hyperlipidemia, unspecified: Secondary | ICD-10-CM | POA: Insufficient documentation

## 2011-09-16 DIAGNOSIS — L259 Unspecified contact dermatitis, unspecified cause: Secondary | ICD-10-CM | POA: Insufficient documentation

## 2011-09-16 DIAGNOSIS — Z79899 Other long term (current) drug therapy: Secondary | ICD-10-CM | POA: Insufficient documentation

## 2011-09-16 DIAGNOSIS — E079 Disorder of thyroid, unspecified: Secondary | ICD-10-CM | POA: Insufficient documentation

## 2011-09-16 MED ORDER — FLUCONAZOLE 100 MG PO TABS
200.0000 mg | ORAL_TABLET | Freq: Once | ORAL | Status: AC
  Administered 2011-09-16: 200 mg via ORAL
  Filled 2011-09-16: qty 2

## 2011-09-16 MED ORDER — DEXAMETHASONE SODIUM PHOSPHATE 10 MG/ML IJ SOLN
10.0000 mg | Freq: Once | INTRAMUSCULAR | Status: AC
  Administered 2011-09-16: 10 mg via INTRAMUSCULAR
  Filled 2011-09-16: qty 1

## 2011-09-16 MED ORDER — HYDROXYZINE HCL 25 MG PO TABS: 25.0000 mg | ORAL_TABLET | Freq: Four times a day (QID) | ORAL | Status: AC

## 2011-09-16 NOTE — ED Notes (Signed)
Pt seen here yesterday for rash on lower abd, buttocks and upper legs. Pt was given clotrimazole cream but has had no relief.

## 2011-09-16 NOTE — ED Provider Notes (Signed)
History     CSN: [id number] Arrival date & time: 09/16/2011  6:22 PM   First MD Initiated Contact with Patient 09/16/11 1902      Chief Complaint  Patient presents with  . Rash    (Consider location/radiation/quality/duration/timing/severity/associated sxs/prior treatment) HPI Comments: Pt was seen here yesterday for rash to bilateral inguinal areas that has been there for the last few days, was felt to be intertrigo.  Pt complains for worsening itching, no itching/redness going down both legs to knees.  No fevers.  No pain.  No drainage  Patient is a 70 y.o. female presenting with rash. The history is provided by the spouse and the patient.  Rash  This is a new problem. The current episode started more than 2 days ago. The problem has been gradually worsening. The problem is associated with nothing. There has been no fever. The rash is present on the left buttock, right buttock and groin. The patient is experiencing no pain. Associated symptoms include itching. Pertinent negatives include no blisters, no pain and no weeping. Treatments tried: clotrimazole cream. The treatment provided no relief.    Past Medical History  Diagnosis Date  . Asthma   . Dementia   . Hyperlipidemia   . Hypothyroidism   . Sleep apnea   . Arthritis   . IBS (irritable bowel syndrome)   . Helicobacter pylori gastritis   . Elevated LFTs   . Hiatal hernia   . Carpal tunnel syndrome     Past Surgical History  Procedure Date  . Replacement total knee     right  . Tonsillectomy   . Breast lumpectomy     left  . Wrist ganglion excision     Family History  Problem Relation Age of Onset  . Diabetes Sister     History  Substance Use Topics  . Smoking status: Never Smoker   . Smokeless tobacco: Never Used  . Alcohol Use: No    OB History    Grav Para Term Preterm Abortions TAB SAB Ect Mult Living                  Review of Systems  Constitutional: Negative for fever, chills, diaphoresis  and fatigue.  HENT: Negative for congestion, rhinorrhea and sneezing.   Eyes: Negative.   Respiratory: Negative for cough, chest tightness and shortness of breath.   Cardiovascular: Negative for chest pain and leg swelling.  Gastrointestinal: Negative for nausea, vomiting, abdominal pain, diarrhea and blood in stool.  Genitourinary: Negative for frequency, hematuria, flank pain and difficulty urinating.  Musculoskeletal: Negative for back pain and arthralgias.  Skin: Positive for itching and rash. Negative for wound.  Neurological: Negative for dizziness, speech difficulty, weakness, numbness and headaches.    Allergies  Review of patient's allergies indicates no known allergies.  Home Medications   Current Outpatient Rx  Name Route Sig Dispense Refill  . ACETAMINOPHEN 500 MG PO TABS Oral Take 1,000 mg by mouth every 6 (six) hours as needed. For pain    . VITAMIN C 1000 MG PO TABS Oral Take 1,000 mg by mouth daily.      . ASPIRIN 81 MG PO TABS Oral Take 81 mg by mouth daily.     Marland Kitchen CALCIUM CARBONATE 600 MG PO TABS Oral Take 600 mg by mouth 2 (two) times daily with a meal.      . VITAMIN D 2000 UNITS PO CAPS Oral Take 1 capsule by mouth daily.     Marland Kitchen  CLOTRIMAZOLE 1 % EX CREA  Apply to affected area 2 times daily 15 g 0  . DONEPEZIL HCL 10 MG PO TABS Oral Take 10 mg by mouth at bedtime.     Marland Kitchen FLUTICASONE-SALMETEROL 250-50 MCG/DOSE IN AEPB Inhalation Inhale 1 puff into the lungs every 12 (twelve) hours.      Marland Kitchen FA-PYRIDOXINE-CYANCOBALAMIN 2.5-25-2 MG PO TABS Oral Take 1 tablet by mouth daily.     Marland Kitchen LEVOTHYROXINE SODIUM 88 MCG PO TABS Oral Take 88 mcg by mouth daily.     Marland Kitchen LOSARTAN POTASSIUM-HCTZ 100-25 MG PO TABS Oral Take 1 tablet by mouth daily.      Marland Kitchen MONTELUKAST SODIUM 10 MG PO TABS Oral Take 10 mg by mouth daily.     Marland Kitchen ONE-DAILY MULTI VITAMINS PO TABS Oral Take 1 tablet by mouth daily.     Marland Kitchen OMEPRAZOLE 20 MG PO CPDR Oral Take 20 mg by mouth daily.      . OXYBUTYNIN 3 (28) % (MG/ACT)  TD GEL Topical Apply 1 application topically daily.      Marland Kitchen POLYETHYLENE GLYCOL 3350 PO PACK Oral Take 17 g by mouth daily.      . TRAMADOL HCL 50 MG PO TABS Oral Take 1 tablet (50 mg total) by mouth every 6 (six) hours as needed for pain. Maximum dose= 8 tablets per day 15 tablet 0  . VITAMIN B-12 1000 MCG PO TABS Oral Take 1,000 mcg by mouth daily.     Marland Kitchen DARIFENACIN HYDROBROMIDE 7.5 MG PO TB24 Oral Take 7.5 mg by mouth daily.     Marland Kitchen HYDROXYZINE HCL 25 MG PO TABS Oral Take 1 tablet (25 mg total) by mouth every 6 (six) hours. 12 tablet 0    BP 141/74  Pulse 70  Temp(Src) 98.7 F (37.1 C) (Oral)  Resp 18  SpO2 100%  Physical Exam  Constitutional: She appears well-developed and well-nourished.  HENT:  Head: Normocephalic and atraumatic.  Eyes: Pupils are equal, round, and reactive to light.  Neck: Normal range of motion. Neck supple.  Cardiovascular: Normal rate, regular rhythm and normal heart sounds.   Pulmonary/Chest: Effort normal and breath sounds normal. No respiratory distress. She has no wheezes. She has no rales. She exhibits no tenderness.  Abdominal: Soft. Bowel sounds are normal. There is no tenderness. There is no rebound and no guarding.  Musculoskeletal: Normal range of motion. She exhibits no edema.  Lymphadenopathy:    She has no cervical adenopathy.  Neurological: She is alert.       Confused, at baseline  Skin: Skin is warm and dry. Rash noted.       Pt has redness to bilateral groin, extending down both legs to knee, blanching.  No vesicles, no purpura.  No drainage.  No pain on palpation.  Has some skin breakdown to inguinal areas.  Has some excoriated areas where she has been scratching  Psychiatric: She has a normal mood and affect.    ED Course  Procedures (including critical care time)  Labs Reviewed - No data to display No results found.   1. Contact dermatitis       MDM  Rash to groin looks consistent with possible candidal infection, but with  spreading down legs and intense itching, feels that there is likely an allergic component.  Does not appear to be cellulitic.  No evidence of necrosis.  Pt well appearing with no signs of systemic illness.  Will tx both candidiasis and allergic component.  Advised husband  to f/u with PMD after holiday weekend, but return here for any worsening symptoms in the meantime        Rolan Bucco, MD 09/16/11 1932

## 2011-09-16 NOTE — ED Notes (Signed)
Pt was seen here yesterday and discharged with rx.  Pt states has been applying cream and "its just not any better"  Explained to pt and family that it may take a few days for rash and sx to subside.  They stated understanding and just wanted to be checked out again

## 2012-07-28 ENCOUNTER — Other Ambulatory Visit: Payer: Self-pay | Admitting: Physician Assistant

## 2012-07-28 NOTE — Telephone Encounter (Signed)
Please pull paper chart.  

## 2012-07-28 NOTE — Telephone Encounter (Signed)
No paper chart °

## 2012-09-07 ENCOUNTER — Emergency Department (HOSPITAL_BASED_OUTPATIENT_CLINIC_OR_DEPARTMENT_OTHER): Payer: Medicare PPO

## 2012-09-07 ENCOUNTER — Encounter (HOSPITAL_BASED_OUTPATIENT_CLINIC_OR_DEPARTMENT_OTHER): Payer: Self-pay | Admitting: *Deleted

## 2012-09-07 ENCOUNTER — Emergency Department (HOSPITAL_BASED_OUTPATIENT_CLINIC_OR_DEPARTMENT_OTHER)
Admission: EM | Admit: 2012-09-07 | Discharge: 2012-09-07 | Disposition: A | Discharge status: Home / Self Care | Payer: Medicare PPO | Attending: Emergency Medicine | Admitting: Emergency Medicine

## 2012-09-07 DIAGNOSIS — R071 Chest pain on breathing: Secondary | ICD-10-CM | POA: Insufficient documentation

## 2012-09-07 DIAGNOSIS — K589 Irritable bowel syndrome without diarrhea: Secondary | ICD-10-CM | POA: Insufficient documentation

## 2012-09-07 DIAGNOSIS — E039 Hypothyroidism, unspecified: Secondary | ICD-10-CM | POA: Insufficient documentation

## 2012-09-07 DIAGNOSIS — Z79899 Other long term (current) drug therapy: Secondary | ICD-10-CM | POA: Insufficient documentation

## 2012-09-07 DIAGNOSIS — E785 Hyperlipidemia, unspecified: Secondary | ICD-10-CM | POA: Insufficient documentation

## 2012-09-07 DIAGNOSIS — J45909 Unspecified asthma, uncomplicated: Secondary | ICD-10-CM | POA: Insufficient documentation

## 2012-09-07 DIAGNOSIS — G4730 Sleep apnea, unspecified: Secondary | ICD-10-CM | POA: Insufficient documentation

## 2012-09-07 DIAGNOSIS — Z8739 Personal history of other diseases of the musculoskeletal system and connective tissue: Secondary | ICD-10-CM | POA: Insufficient documentation

## 2012-09-07 DIAGNOSIS — R0789 Other chest pain: Secondary | ICD-10-CM

## 2012-09-07 DIAGNOSIS — Z8719 Personal history of other diseases of the digestive system: Secondary | ICD-10-CM | POA: Insufficient documentation

## 2012-09-07 LAB — CBC WITH DIFFERENTIAL/PLATELET
Eosinophils Relative: 3 % (ref 0–5)
Hemoglobin: 11.6 g/dL — ABNORMAL LOW (ref 12.0–15.0)
Lymphocytes Relative: 25 % (ref 12–46)
Lymphs Abs: 1.3 10*3/uL (ref 0.7–4.0)
MCH: 29.1 pg (ref 26.0–34.0)
MCV: 90.5 fL (ref 78.0–100.0)
Monocytes Relative: 8 % (ref 3–12)
Neutrophils Relative %: 63 % (ref 43–77)
Platelets: 186 10*3/uL (ref 150–400)
RBC: 3.99 MIL/uL (ref 3.87–5.11)
WBC: 5.1 10*3/uL (ref 4.0–10.5)

## 2012-09-07 LAB — TROPONIN I: Troponin I: <0.3 ng/mL (ref <0.30)

## 2012-09-07 LAB — COMPREHENSIVE METABOLIC PANEL
ALT: 12 U/L (ref 0–35)
Alkaline Phosphatase: 66 U/L (ref 39–117)
BUN: 15 mg/dL (ref 6–23)
CO2: 25 mEq/L (ref 19–32)
GFR calc Af Amer: 57 mL/min — ABNORMAL LOW (ref >90)
GFR calc non Af Amer: 49 mL/min — ABNORMAL LOW (ref >90)
Glucose, Bld: 95 mg/dL (ref 70–99)
Potassium: 3.4 mEq/L — ABNORMAL LOW (ref 3.5–5.1)
Sodium: 141 mEq/L (ref 135–145)
Total Bilirubin: 0.7 mg/dL (ref 0.3–1.2)

## 2012-09-07 MED ORDER — TRAMADOL HCL 50 MG PO TABS
50.0000 mg | ORAL_TABLET | Freq: Once | ORAL | Status: AC
  Administered 2012-09-07: 50 mg via ORAL
  Filled 2012-09-07: qty 1

## 2012-09-07 MED ORDER — TRAMADOL HCL 50 MG PO TABS: 50.0000 mg | ORAL_TABLET | Freq: Four times a day (QID) | ORAL | Status: DC | PRN

## 2012-09-07 NOTE — ED Notes (Signed)
Patient back from Radiology.

## 2012-09-07 NOTE — ED Provider Notes (Signed)
History     CSN: [id number]  Arrival date & time 09/07/12  1605   First MD Initiated Contact with Patient 09/07/12 1621      Chief Complaint  Patient presents with  . Chest Pain    (Consider location/radiation/quality/duration/timing/severity/associated sxs/prior treatment) HPI Pt is demented and history is limited. Level 5 caveat. Most of the history taken from husband. Pt started c/o of sternal CP around noon which seemed to resolve and then worsen. No SOB, cough, fever chills. Cardiac cath 2010 with no coronary blockage. D/C by cardiologist service. No recent hospitalizations.  Past Medical History  Diagnosis Date  . Asthma   . Dementia   . Hyperlipidemia   . Hypothyroidism   . Sleep apnea   . Arthritis   . IBS (irritable bowel syndrome)   . Helicobacter pylori gastritis   . Elevated LFTs   . Hiatal hernia   . Carpal tunnel syndrome     Past Surgical History  Procedure Date  . Replacement total knee     right  . Tonsillectomy   . Breast lumpectomy     left  . Wrist ganglion excision     Family History  Problem Relation Age of Onset  . Diabetes Sister     History  Substance Use Topics  . Smoking status: Never Smoker   . Smokeless tobacco: Never Used  . Alcohol Use: No    OB History    Grav Para Term Preterm Abortions TAB SAB Ect Mult Living                  Review of Systems  Constitutional: Negative for fever and chills.  Respiratory: Negative for cough and shortness of breath.   Cardiovascular: Positive for chest pain.  Gastrointestinal: Negative for nausea, vomiting and abdominal pain.    Allergies  Review of patient's allergies indicates no known allergies.  Home Medications   Current Outpatient Rx  Name  Route  Sig  Dispense  Refill  . MEMANTINE HCL 10 MG PO TABS   Oral   Take 10 mg by mouth 2 (two) times daily.         Marland Kitchen NAPROXEN SODIUM 220 MG PO TABS   Oral   Take 220 mg by mouth 2 (two) times daily with a meal.         .  ACETAMINOPHEN 500 MG PO TABS   Oral   Take 1,000 mg by mouth every 6 (six) hours as needed. For pain         . VITAMIN C 1000 MG PO TABS   Oral   Take 1,000 mg by mouth daily.           . ASPIRIN 81 MG PO TABS   Oral   Take 81 mg by mouth daily.          Marland Kitchen CALCIUM CARBONATE 600 MG PO TABS   Oral   Take 600 mg by mouth 2 (two) times daily with a meal.           . VITAMIN D 2000 UNITS PO CAPS   Oral   Take 1 capsule by mouth daily.          Marland Kitchen CLOTRIMAZOLE 1 % EX CREA      Apply to affected area 2 times daily   15 g   0   . DARIFENACIN HYDROBROMIDE ER 7.5 MG PO TB24   Oral   Take 7.5 mg by mouth daily.          Marland Kitchen  DONEPEZIL HCL 10 MG PO TABS   Oral   Take 10 mg by mouth at bedtime.          Marland Kitchen FLUTICASONE-SALMETEROL 250-50 MCG/DOSE IN AEPB   Inhalation   Inhale 1 puff into the lungs every 12 (twelve) hours.           Marland Kitchen FA-PYRIDOXINE-CYANCOBALAMIN 2.5-25-2 MG PO TABS   Oral   Take 1 tablet by mouth daily.          Marland Kitchen LEVOTHYROXINE SODIUM 88 MCG PO TABS   Oral   Take 88 mcg by mouth daily.          Marland Kitchen LOSARTAN POTASSIUM-HCTZ 100-25 MG PO TABS   Oral   Take 1 tablet by mouth daily.           Marland Kitchen MONTELUKAST SODIUM 10 MG PO TABS   Oral   Take 10 mg by mouth daily.          Marland Kitchen ONE-DAILY MULTI VITAMINS PO TABS   Oral   Take 1 tablet by mouth daily.          Marland Kitchen OMEPRAZOLE 20 MG PO CPDR   Oral   Take 20 mg by mouth daily.           . OXYBUTYNIN 3 (28) % (MG/ACT) TD GEL   Topical   Apply 1 application topically daily.           Marland Kitchen POLYETHYLENE GLYCOL 3350 PO PACK   Oral   Take 17 g by mouth daily.           . TRAMADOL HCL 50 MG PO TABS   Oral   Take 1 tablet (50 mg total) by mouth every 6 (six) hours as needed for pain.   15 tablet   0   . VITAMIN B-12 1000 MCG PO TABS   Oral   Take 1,000 mcg by mouth daily.            BP 135/77  Pulse 67  Temp 98.2 F (36.8 C) (Oral)  Resp 16  Ht 5\' 1"  (1.549 m)  Wt 143 lb (64.864  kg)  BMI 27.02 kg/m2  SpO2 100%  Physical Exam  Nursing note and vitals reviewed. Constitutional: She appears well-developed and well-nourished. No distress.  HENT:  Head: Normocephalic and atraumatic.  Mouth/Throat: Oropharynx is clear and moist.  Eyes: EOM are normal. Pupils are equal, round, and reactive to light.  Neck: Normal range of motion. Neck supple.  Cardiovascular: Normal rate and regular rhythm.   Pulmonary/Chest: Effort normal and breath sounds normal. No respiratory distress. She has no wheezes. She has no rales. She exhibits tenderness (pain reproduced completely with light palpation of sternum and lower R ribs. No crepitance. ).  Abdominal: Soft. Bowel sounds are normal. She exhibits no mass. There is no tenderness. There is no rebound and no guarding.  Musculoskeletal: Normal range of motion. She exhibits no edema and no tenderness.       No lower ext swelling or tenderness  Neurological: She is alert.       Confused. Follows simple command. No focal deficits  Skin: Skin is warm and dry. No rash noted. No erythema.  Psychiatric: She has a normal mood and affect. Her behavior is normal.    ED Course  Procedures (including critical care time)  Labs Reviewed  CBC WITH DIFFERENTIAL - Abnormal; Notable for the following:    Hemoglobin 11.6 (*)     All other components within  normal limits  COMPREHENSIVE METABOLIC PANEL - Abnormal; Notable for the following:    Potassium 3.4 (*)     GFR calc non Af Amer 49 (*)     GFR calc Af Amer 57 (*)     All other components within normal limits  TROPONIN I   Dg Chest 2 View  09/07/2012  *RADIOLOGY REPORT*  Clinical Data: Chest pain.  Asthma.  CHEST - 2 VIEW  Comparison: 08/31/2009  Findings: Heart size is normal.  The aorta is unfolded.  The lungs are clear except for small granuloma in the right lateral midlung. No infiltrate, collapse or effusion.  Ordinary degenerative changes effect the spine.  IMPRESSION: No active  disease.  Unfolding of the aorta.  Right lung granuloma.   Original Report Authenticated By: Paulina Fusi, M.D.      1. Anterior chest wall pain      Date: 09/07/2012  Rate:61  Rhythm: normal sinus rhythm  QRS Axis: normal  Intervals: normal  ST/T Wave abnormalities: nonspecific T wave changes  Conduction Disutrbances:none  Narrative Interpretation:   Old EKG Reviewed: none available    MDM   CAD unlikely given recent cath and reproduced pain with palpation.   Pt states pain is improved after Ultram. Pt and husband encouraged to f/u immediately for worsening pain, SOB, fever chills or any concerns.      Loren Racer, MD 09/07/12 [phone]

## 2012-09-07 NOTE — ED Notes (Signed)
Pt c/o left sided chest pain off and on today

## 2012-09-14 NOTE — ED Notes (Signed)
Opened chart to obtain troponin level for [facility] visit follow up today result given via phone to nicole at [phone]

## 2012-10-04 ENCOUNTER — Ambulatory Visit (INDEPENDENT_AMBULATORY_CARE_PROVIDER_SITE_OTHER): Payer: Medicare PPO | Admitting: Internal Medicine

## 2012-10-04 ENCOUNTER — Encounter: Payer: Self-pay | Admitting: Internal Medicine

## 2012-10-04 VITALS — BP 130/70 | HR 68 | Ht 61.0 in | Wt 144.8 lb

## 2012-10-04 DIAGNOSIS — M940 Chondrocostal junction syndrome [Tietze]: Secondary | ICD-10-CM

## 2012-10-04 MED ORDER — DICLOFENAC SODIUM 1 % TD GEL: 4.0000 g | Freq: Four times a day (QID) | TRANSDERMAL | Status: AC

## 2012-10-04 MED ORDER — DICLOFENAC SODIUM 1 % TD GEL: 4.0000 g | Freq: Four times a day (QID) | TRANSDERMAL | Status: DC

## 2012-10-04 NOTE — Progress Notes (Signed)
Subjective:    Patient ID: Kelsey Norris, female    DOB: 1941-09-07, 71 y.o.   MRN: [id number]  HPI This elderly patient with dementia presents with her husband. She's been complaining of chest discomfort for the past several weeks, starting in early November. She has been urgent care and then to her primary care practice where EKG, x-rays, including a chest CT pulmonary angiogram performed because of a positive d-dimer were all unrevealing. She is not having any problem swallowing. She is complaining of pain in the chest before she eats at times. There is pain when she moves her arms as well, the pain in the chest.  No Known Allergies Outpatient Prescriptions Prior to Visit  Medication Sig Dispense Refill  . Ascorbic Acid (VITAMIN C) 1000 MG tablet Take 1,000 mg by mouth daily.        Marland Kitchen aspirin 81 MG tablet Take 81 mg by mouth daily.       . calcium carbonate (OS-CAL) 600 MG TABS Take 600 mg by mouth 2 (two) times daily with a meal.        . Cholecalciferol (VITAMIN D) 2000 UNITS CAPS Take 1 capsule by mouth daily.       Marland Kitchen donepezil (ARICEPT) 10 MG tablet Take 10 mg by mouth at bedtime.       . Fluticasone-Salmeterol (ADVAIR DISKUS) 250-50 MCG/DOSE AEPB Inhale 1 puff into the lungs every 12 (twelve) hours.        . folic acid-pyridoxine-cyancobalamin (FOLBIC) 2.5-25-2 MG TABS Take 1 tablet by mouth daily.       Marland Kitchen levothyroxine (LEVOXYL) 88 MCG tablet Take 88 mcg by mouth daily.       Marland Kitchen losartan-hydrochlorothiazide (HYZAAR) 100-25 MG per tablet Take 1 tablet by mouth daily.        . memantine (NAMENDA) 10 MG tablet Take 10 mg by mouth 2 (two) times daily.      . montelukast (SINGULAIR) 10 MG tablet Take 10 mg by mouth daily.       . Multiple Vitamin (MULTIVITAMIN) tablet Take 1 tablet by mouth daily.       Marland Kitchen omeprazole (PRILOSEC) 20 MG capsule Take 20 mg by mouth daily.        . Oxybutynin (GELNIQUE) 3 (28) % (MG/ACT) GEL Apply 1 application topically daily.        . polyethylene glycol  (MIRALAX / GLYCOLAX) packet Take 17 g by mouth daily.        . vitamin B-12 (CYANOCOBALAMIN) 1000 MCG tablet Take 1,000 mcg by mouth daily.       . [DISCONTINUED] acetaminophen ([facility] NON-ASPIRIN EXTRA STRENGTH) 500 MG tablet Take 1,000 mg by mouth every 6 (six) hours as needed. For pain      . [DISCONTINUED] darifenacin (ENABLEX) 7.5 MG 24 hr tablet Take 7.5 mg by mouth daily.       . [DISCONTINUED] naproxen sodium (ANAPROX) 220 MG tablet Take 220 mg by mouth 2 (two) times daily with a meal.      . [DISCONTINUED] traMADol (ULTRAM) 50 MG tablet Take 1 tablet (50 mg total) by mouth every 6 (six) hours as needed for pain.  15 tablet  0   Last reviewed on 10/04/2012 11:07 AM by Iva Boop, MD Past Medical History  Diagnosis Date  . Asthma   . Dementia   . Hyperlipidemia   . Hypothyroidism   . Sleep apnea   . Arthritis   . IBS (irritable bowel syndrome)   . Helicobacter pylori  gastritis   . Elevated LFTs   . Hiatal hernia   . Carpal tunnel syndrome    Past Surgical History  Procedure Date  . 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 Tonsillectomy   . Breast lumpectomy     left  . Wrist ganglion excision     left  . Ear lymphectomy   . Carpal tunnel release 1994    bi-lateral  . Abdominal hysterectomy   . Thyroid surgery     Review of Systems Memory disturbance persists.    Objective:   Physical Exam General:  NAD Eyes:   Anicteric Pharynx: clear Lungs:  Clear Chest wall: Tender over sternum, xiphoid and also pain in chest with arm and shoulder movement Heart:  S1S2 no rubs, murmurs or gallops Abdomen:  soft and nontender, BS+ Ext:   no edema    Data Reviewed: Chest CT [facility], 09/16/2012 no evidence of pulmonary embolism  .   Assessment & Plan:   1. Costochondritis    1. Local care with heating pad 2. Add voltaren gel x 1 month 3. Return to GI prn - if this persists f/u PCP  CC: Iona Hansen, NP

## 2012-10-04 NOTE — Patient Instructions (Addendum)
Today we are giving you a handout on costochondritis.  We have sent the following medications to your pharmacy for you to pick up at your convenience: Voltaren Gel  Follow up with [place] as needed.  Thank you for choosing me and [facility].  Iva Boop, M.D., [facility]      Cancelled Voltaren with [facility] and re-sent to [facility] per pt request.

## 2012-10-06 NOTE — Progress Notes (Signed)
Patient ID: Kelsey Norris, female   DOB: 1941-02-19, 71 y.o.   MRN: [id number] Started PR request 10/05/12 and completed and faxed form today to [employer] at [phone].  Awaiting to hear if Voltaren Gel approved for pts. Costochondritis.

## 2012-10-06 NOTE — Progress Notes (Signed)
Patient ID: Kelsey Norris, female   DOB: 1941/03/21, 71 y.o.   MRN: [id number] Voltaren Gel approved from 09/06/12-10/06/13 thru [facility].

## 2012-10-31 ENCOUNTER — Encounter (HOSPITAL_BASED_OUTPATIENT_CLINIC_OR_DEPARTMENT_OTHER): Payer: Self-pay

## 2012-10-31 ENCOUNTER — Emergency Department (HOSPITAL_BASED_OUTPATIENT_CLINIC_OR_DEPARTMENT_OTHER)
Admission: EM | Admit: 2012-10-31 | Discharge: 2012-10-31 | Disposition: A | Discharge status: Home / Self Care | Payer: Medicare PPO | Attending: Emergency Medicine | Admitting: Emergency Medicine

## 2012-10-31 DIAGNOSIS — G4730 Sleep apnea, unspecified: Secondary | ICD-10-CM | POA: Insufficient documentation

## 2012-10-31 DIAGNOSIS — Z7982 Long term (current) use of aspirin: Secondary | ICD-10-CM | POA: Insufficient documentation

## 2012-10-31 DIAGNOSIS — Z8739 Personal history of other diseases of the musculoskeletal system and connective tissue: Secondary | ICD-10-CM | POA: Insufficient documentation

## 2012-10-31 DIAGNOSIS — Z8719 Personal history of other diseases of the digestive system: Secondary | ICD-10-CM | POA: Insufficient documentation

## 2012-10-31 DIAGNOSIS — J3489 Other specified disorders of nose and nasal sinuses: Secondary | ICD-10-CM | POA: Insufficient documentation

## 2012-10-31 DIAGNOSIS — Z79899 Other long term (current) drug therapy: Secondary | ICD-10-CM | POA: Insufficient documentation

## 2012-10-31 DIAGNOSIS — Z90710 Acquired absence of both cervix and uterus: Secondary | ICD-10-CM | POA: Insufficient documentation

## 2012-10-31 DIAGNOSIS — E785 Hyperlipidemia, unspecified: Secondary | ICD-10-CM | POA: Insufficient documentation

## 2012-10-31 DIAGNOSIS — J069 Acute upper respiratory infection, unspecified: Secondary | ICD-10-CM | POA: Insufficient documentation

## 2012-10-31 DIAGNOSIS — F0390 Unspecified dementia without behavioral disturbance: Secondary | ICD-10-CM | POA: Insufficient documentation

## 2012-10-31 DIAGNOSIS — J45909 Unspecified asthma, uncomplicated: Secondary | ICD-10-CM | POA: Insufficient documentation

## 2012-10-31 DIAGNOSIS — E039 Hypothyroidism, unspecified: Secondary | ICD-10-CM | POA: Insufficient documentation

## 2012-10-31 NOTE — ED Notes (Signed)
Pt reports cough and cold symptoms that started yesterday.

## 2012-10-31 NOTE — ED Provider Notes (Addendum)
History     CSN: [id number]  Arrival date & time 10/31/12  1036   First MD Initiated Contact with Patient 10/31/12 1056      Chief Complaint  Patient presents with  . Cough  . URI    (Consider location/radiation/quality/duration/timing/severity/associated sxs/prior treatment) Patient is a 72 y.o. female presenting with cough and URI. The history is provided by the spouse.  Cough This is a new problem. The current episode started yesterday. The problem occurs every few minutes. The problem has not changed since onset.The cough is non-productive. There has been no fever. Associated symptoms include rhinorrhea. Pertinent negatives include no chills, no headaches, no sore throat, no shortness of breath and no wheezing. She has tried nothing for the symptoms. The treatment provided no relief. She is not a smoker. Her past medical history does not include COPD or asthma.  URI The primary symptoms include cough. Primary symptoms do not include headaches, sore throat or wheezing.  The onset of the illness is associated with exposure to sick contacts. Symptoms associated with the illness include congestion and rhinorrhea. The illness is not associated with chills. Risk factors for severe complications from URI include being elderly.    Past Medical History  Diagnosis Date  . Asthma   . Dementia   . Hyperlipidemia   . Hypothyroidism   . Sleep apnea   . Arthritis   . IBS (irritable bowel syndrome)   . Helicobacter pylori gastritis   . Elevated LFTs   . Hiatal hernia   . Carpal tunnel syndrome     Past Surgical History  Procedure Date  . 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 Tonsillectomy   . Breast lumpectomy     left  . Wrist ganglion excision     left  . Ear lymphectomy   . Carpal tunnel release 1994    bi-lateral  . Abdominal hysterectomy   . Thyroid surgery     Family History  Problem Relation Age of Onset  . Diabetes Sister     History  Substance Use Topics    . Smoking status: Never Smoker   . Smokeless tobacco: Never Used  . Alcohol Use: No    OB History    Grav Para Term Preterm Abortions TAB SAB Ect Mult Living                  Review of Systems  Constitutional: Negative for chills.  HENT: Positive for congestion and rhinorrhea. Negative for sore throat.   Respiratory: Positive for cough. Negative for shortness of breath and wheezing.   Neurological: Negative for headaches.  All other systems reviewed and are negative.    Allergies  Review of patient's allergies indicates no known allergies.  Home Medications   Current Outpatient Rx  Name  Route  Sig  Dispense  Refill  . VITAMIN C 1000 MG PO TABS   Oral   Take 1,000 mg by mouth daily.           . ASPIRIN 81 MG PO TABS   Oral   Take 81 mg by mouth daily.          Marland Kitchen CALCIUM CARBONATE 600 MG PO TABS   Oral   Take 600 mg by mouth 2 (two) times daily with a meal.           . VITAMIN D 2000 UNITS PO CAPS   Oral   Take 1 capsule by mouth daily.          Marland Kitchen  DICLOFENAC SODIUM 1 % TD GEL   Topical   Apply 4 g topically 4 (four) times daily.   100 g   0   . DONEPEZIL HCL 10 MG PO TABS   Oral   Take 10 mg by mouth at bedtime.          Marland Kitchen FLUTICASONE-SALMETEROL 250-50 MCG/DOSE IN AEPB   Inhalation   Inhale 1 puff into the lungs every 12 (twelve) hours.           Marland Kitchen FA-PYRIDOXINE-CYANCOBALAMIN 2.5-25-2 MG PO TABS   Oral   Take 1 tablet by mouth daily.          Marland Kitchen LEVOTHYROXINE SODIUM 88 MCG PO TABS   Oral   Take 88 mcg by mouth daily.          Marland Kitchen LOSARTAN POTASSIUM-HCTZ 100-25 MG PO TABS   Oral   Take 1 tablet by mouth daily.           Marland Kitchen MEMANTINE HCL 10 MG PO TABS   Oral   Take 10 mg by mouth 2 (two) times daily.         Marland Kitchen MONTELUKAST SODIUM 10 MG PO TABS   Oral   Take 10 mg by mouth daily.          Marland Kitchen ONE-DAILY MULTI VITAMINS PO TABS   Oral   Take 1 tablet by mouth daily.          Marland Kitchen NAPROXEN SODIUM 220 MG PO CAPS   Oral   Take 1  capsule by mouth as needed.         Marland Kitchen OMEPRAZOLE 20 MG PO CPDR   Oral   Take 20 mg by mouth daily.           . OXYBUTYNIN 3 (28) % (MG/ACT) TD GEL   Topical   Apply 1 application topically daily.           Marland Kitchen POLYETHYLENE GLYCOL 3350 PO PACK   Oral   Take 17 g by mouth daily.           Marland Kitchen VITAMIN B-12 1000 MCG PO TABS   Oral   Take 1,000 mcg by mouth daily.            BP 147/87  Pulse 61  Temp 97.9 F (36.6 C) (Oral)  Resp 16  Ht 5' (1.524 m)  Wt 145 lb (65.772 kg)  BMI 28.32 kg/m2  SpO2 100%  Physical Exam  Nursing note and vitals reviewed. Constitutional: She is oriented to person, place, and time. She appears well-developed and well-nourished. No distress.  HENT:  Head: Normocephalic and atraumatic.  Right Ear: Tympanic membrane and ear canal normal.  Left Ear: Tympanic membrane and ear canal normal.  Nose: Mucosal edema present.  Mouth/Throat: Oropharynx is clear and moist and mucous membranes are normal.  Eyes: Conjunctivae normal and EOM are normal. Pupils are equal, round, and reactive to light.  Neck: Normal range of motion. Neck supple.  Cardiovascular: Normal rate, regular rhythm and intact distal pulses.   No murmur heard. Pulmonary/Chest: Effort normal and breath sounds normal. No respiratory distress. She has no wheezes. She has no rales.  Abdominal: Soft. She exhibits no distension. There is no tenderness. There is no rebound and no guarding.  Musculoskeletal: Normal range of motion. She exhibits no edema and no tenderness.  Neurological: She is alert and oriented to person, place, and time.  Skin: Skin is warm and dry. No rash noted. No  erythema.  Psychiatric: She has a normal mood and affect. Her behavior is normal.    ED Course  Procedures (including critical care time)  Labs Reviewed - No data to display No results found.   No diagnosis found.    MDM   Pt with symptoms consistent with viral URI.  Well appearing here.  No signs  of breathing difficulty  No signs of pharyngitis, otitis or abnormal abdominal findings.   Husband with same sx pt to return with any further problems.         Gwyneth Sprout, MD 10/31/12 1146  Gwyneth Sprout, MD 10/31/12 1147

## 2012-10-31 NOTE — ED Notes (Signed)
MD at bedside. 

## 2012-10-31 NOTE — ED Notes (Signed)
Pt has had a cough and cold symptoms that started yesterday.

## 2012-12-16 ENCOUNTER — Telehealth: Payer: Self-pay | Admitting: Internal Medicine

## 2012-12-16 NOTE — Telephone Encounter (Signed)
Patient has a return of her chest pain she had improvement with Voltaren gel.  Husband notes it is the exact same pain she had in December when she was diagnosed with costochondritis.  The patient's husband is advised that she should follow up with her primary care for this.  He or she will call back for GI concerns

## 2013-11-05 ENCOUNTER — Emergency Department (HOSPITAL_BASED_OUTPATIENT_CLINIC_OR_DEPARTMENT_OTHER)
Admission: EM | Admit: 2013-11-05 | Discharge: 2013-11-05 | Disposition: A | Discharge status: Home / Self Care | Payer: Medicare PPO | Attending: Emergency Medicine | Admitting: Emergency Medicine

## 2013-11-05 ENCOUNTER — Encounter (HOSPITAL_BASED_OUTPATIENT_CLINIC_OR_DEPARTMENT_OTHER): Payer: Self-pay | Admitting: Emergency Medicine

## 2013-11-05 ENCOUNTER — Emergency Department (HOSPITAL_BASED_OUTPATIENT_CLINIC_OR_DEPARTMENT_OTHER): Payer: Medicare PPO

## 2013-11-05 DIAGNOSIS — Z8719 Personal history of other diseases of the digestive system: Secondary | ICD-10-CM | POA: Insufficient documentation

## 2013-11-05 DIAGNOSIS — M129 Arthropathy, unspecified: Secondary | ICD-10-CM | POA: Insufficient documentation

## 2013-11-05 DIAGNOSIS — Z8619 Personal history of other infectious and parasitic diseases: Secondary | ICD-10-CM | POA: Insufficient documentation

## 2013-11-05 DIAGNOSIS — E039 Hypothyroidism, unspecified: Secondary | ICD-10-CM | POA: Insufficient documentation

## 2013-11-05 DIAGNOSIS — Z791 Long term (current) use of non-steroidal anti-inflammatories (NSAID): Secondary | ICD-10-CM | POA: Insufficient documentation

## 2013-11-05 DIAGNOSIS — Z8669 Personal history of other diseases of the nervous system and sense organs: Secondary | ICD-10-CM | POA: Insufficient documentation

## 2013-11-05 DIAGNOSIS — F0390 Unspecified dementia without behavioral disturbance: Secondary | ICD-10-CM | POA: Insufficient documentation

## 2013-11-05 DIAGNOSIS — IMO0002 Reserved for concepts with insufficient information to code with codable children: Secondary | ICD-10-CM | POA: Insufficient documentation

## 2013-11-05 DIAGNOSIS — Z79899 Other long term (current) drug therapy: Secondary | ICD-10-CM | POA: Insufficient documentation

## 2013-11-05 DIAGNOSIS — J45909 Unspecified asthma, uncomplicated: Secondary | ICD-10-CM | POA: Insufficient documentation

## 2013-11-05 DIAGNOSIS — Z7982 Long term (current) use of aspirin: Secondary | ICD-10-CM | POA: Insufficient documentation

## 2013-11-05 DIAGNOSIS — J218 Acute bronchiolitis due to other specified organisms: Secondary | ICD-10-CM | POA: Insufficient documentation

## 2013-11-05 DIAGNOSIS — J219 Acute bronchiolitis, unspecified: Secondary | ICD-10-CM

## 2013-11-05 MED ORDER — ALBUTEROL SULFATE HFA 108 (90 BASE) MCG/ACT IN AERS
2.0000 | INHALATION_SPRAY | Freq: Once | RESPIRATORY_TRACT | Status: AC
  Administered 2013-11-05: 2 via RESPIRATORY_TRACT
  Filled 2013-11-05: qty 6.7

## 2013-11-05 MED ORDER — PREDNISONE 20 MG PO TABS: ORAL_TABLET | ORAL | Status: DC

## 2013-11-05 NOTE — Discharge Instructions (Signed)
Bronchitis °Bronchitis is swelling (inflammation) of the air tubes leading to your lungs (bronchi). This causes mucus and a cough. If the swelling gets bad, you may have trouble breathing. °HOME CARE  °· Rest. °· Drink enough fluids to keep your pee (urine) clear or pale yellow (unless you have a condition where you have to watch how much you drink). °· Only take medicine as told by your doctor. If you were given antibiotic medicines, finish them even if you start to feel better. °· Avoid smoke, irritating chemicals, and strong smells. These make the problem worse. Quit smoking if you smoke. This helps your lungs heal faster. °· Use a cool mist humidifier. Change the water in the humidifier every day. You can also sit in the bathroom with hot shower running for 5 10 minutes. Keep the door closed. °· See your health care provider as told. °· Wash your hands often. °GET HELP IF: °Your problems do not get better after 1 week. °GET HELP RIGHT AWAY IF:  °· Your fever gets worse. °· You have chills. °· Your chest hurts. °· Your problems breathing get worse. °· You have blood in your mucus. °· You pass out (faint). °· You feel lightheaded. °· You have a bad headache. °· You throw up (vomit) again and again. °MAKE SURE YOU: °· Understand these instructions. °· Will watch your condition. °· Will get help right away if you are not doing well or get worse. °Document Released: 03/30/2008 Document Revised: 08/02/2013 Document Reviewed: 06/06/2013 °[employer]® Patient Information ©2014 [employer]. ° °

## 2013-11-05 NOTE — ED Notes (Signed)
Patient transported to X-ray 

## 2013-11-05 NOTE — ED Notes (Signed)
Pt has been coughing for over one week.  Saw PCP on Monday and was given mucinex and robitussin.   Pt started to get worse yesterday.  No known fever.  Productive yellow sputum.   Eating and drinking well.

## 2013-11-05 NOTE — ED Provider Notes (Signed)
CSN: [id number]     Arrival date & time 11/05/13  0755 History   First MD Initiated Contact with Patient 11/05/13 0809     Chief Complaint  Patient presents with  . Cough   (Consider location/radiation/quality/duration/timing/severity/associated sxs/prior Treatment) Patient is a 73 y.o. female presenting with cough. The history is provided by the patient and the spouse. No language interpreter was used.  Cough Cough characteristics:  Productive Sputum characteristics:  Nondescript Severity:  Moderate Duration:  1 week Timing:  Constant Progression:  Unchanged Chronicity:  New Smoker: no   Relieved by:  Nothing Worsened by:  Nothing tried Ineffective treatments:  None tried Associated symptoms: no chest pain, no chills, no diaphoresis, no eye discharge, no fever, no headaches, no rhinorrhea, no shortness of breath, no sinus congestion and no sore throat   Risk factors: no chemical exposure, no recent infection and no recent travel   Risk factors comment:  +sick contacts   Past Medical History  Diagnosis Date  . Asthma   . Dementia   . Hyperlipidemia   . Hypothyroidism   . Sleep apnea   . Arthritis   . IBS (irritable bowel syndrome)   . Helicobacter pylori gastritis   . Elevated LFTs   . Hiatal hernia   . Carpal tunnel syndrome    Past Surgical History  Procedure Laterality Date  . Replacement total knee  06/2010    right  . Tonsillectomy    . Breast lumpectomy      left  . Wrist ganglion excision      left  . Ear lymphectomy    . Carpal tunnel release  1994    bi-lateral  . Abdominal hysterectomy    . Thyroid surgery     Family History  Problem Relation Age of Onset  . Diabetes Sister    History  Substance Use Topics  . Smoking status: Never Smoker   . Smokeless tobacco: Never Used  . Alcohol Use: No   OB History   Grav Para Term Preterm Abortions TAB SAB Ect Mult Living                 Review of Systems  Constitutional: Negative for fever, chills,  diaphoresis, activity change, appetite change and fatigue.  HENT: Negative for congestion, facial swelling, rhinorrhea and sore throat.   Eyes: Negative for photophobia and discharge.  Respiratory: Positive for cough. Negative for chest tightness and shortness of breath.   Cardiovascular: Negative for chest pain, palpitations and leg swelling.  Gastrointestinal: Negative for nausea, vomiting, abdominal pain and diarrhea.  Endocrine: Negative for polydipsia and polyuria.  Genitourinary: Negative for dysuria, frequency, difficulty urinating and pelvic pain.  Musculoskeletal: Negative for arthralgias, back pain, neck pain and neck stiffness.  Skin: Negative for color change and wound.  Allergic/Immunologic: Negative for immunocompromised state.  Neurological: Negative for facial asymmetry, weakness, numbness and headaches.  Hematological: Does not bruise/bleed easily.  Psychiatric/Behavioral: Negative for confusion and agitation.    Allergies  Review of patient's allergies indicates no known allergies.  Home Medications   Current Outpatient Rx  Name  Route  Sig  Dispense  Refill  . albuterol (PROVENTIL) (2.5 MG/3ML) 0.083% nebulizer solution   Nebulization   Take 2.5 mg by nebulization 2 (two) times daily as needed for wheezing or shortness of breath.         . Ascorbic Acid (VITAMIN C) 1000 MG tablet   Oral   Take 1,000 mg by mouth daily.           .Marland Kitchen  aspirin 81 MG tablet   Oral   Take 81 mg by mouth daily.          . calcium carbonate (OS-CAL) 600 MG TABS   Oral   Take 600 mg by mouth 2 (two) times daily with a meal.           . diclofenac sodium (VOLTAREN) 1 % GEL   Topical   Apply 4 g topically 2 (two) times daily.         Marland Kitchen donepezil (ARICEPT) 10 MG tablet   Oral   Take 10 mg by mouth at bedtime.          . fexofenadine (ALLEGRA) 180 MG tablet   Oral   Take 180 mg by mouth daily.         . fluticasone (FLOVENT HFA) 110 MCG/ACT inhaler   Inhalation    Inhale 2 puffs into the lungs 2 (two) times daily.         . folic acid-pyridoxine-cyancobalamin (FOLBIC) 2.5-25-2 MG TABS   Oral   Take 1 tablet by mouth daily.          . Iron TABS   Oral   Take 65 mg by mouth every morning.         Marland Kitchen levothyroxine (LEVOXYL) 88 MCG tablet   Oral   Take 88 mcg by mouth daily.          Marland Kitchen losartan-hydrochlorothiazide (HYZAAR) 100-25 MG per tablet   Oral   Take 1 tablet by mouth daily.           . memantine (NAMENDA) 10 MG tablet   Oral   Take 10 mg by mouth 2 (two) times daily.         . montelukast (SINGULAIR) 10 MG tablet   Oral   Take 10 mg by mouth daily.          . Multiple Vitamin (MULTIVITAMIN) tablet   Oral   Take 1 tablet by mouth daily.          . Naproxen Sodium (ALEVE) 220 MG CAPS   Oral   Take 1 capsule by mouth as needed.         Marland Kitchen omeprazole (PRILOSEC) 20 MG capsule   Oral   Take 20 mg by mouth daily.           . Oxybutynin (GELNIQUE) 3 (28) % (MG/ACT) GEL   Topical   Apply 1 application topically daily.           . polyethylene glycol (MIRALAX / GLYCOLAX) packet   Oral   Take 17 g by mouth daily.           . vitamin B-12 (CYANOCOBALAMIN) 1000 MCG tablet   Oral   Take 1,000 mcg by mouth daily.          . predniSONE (DELTASONE) 20 MG tablet      3 tabs po day one, then 2 po daily x 4 days   11 tablet   0    BP 149/77  Pulse 63  Temp(Src) 98.3 F (36.8 C) (Oral)  Resp 16  Wt 144 lb (65.318 kg)  SpO2 100% Physical Exam  Constitutional: She is oriented to person, place, and time. She appears well-developed and well-nourished. No distress.  HENT:  Head: Normocephalic and atraumatic.  Mouth/Throat: No oropharyngeal exudate.  Eyes: Pupils are equal, round, and reactive to light.  Neck: Normal range of motion. Neck supple.  Cardiovascular: Normal rate, regular  rhythm and normal heart sounds.  Exam reveals no gallop and no friction rub.   No murmur heard. Pulmonary/Chest: Effort  normal. No respiratory distress. She has no wheezes. She has rales in the right lower field and the left lower field.  Abdominal: Soft. Bowel sounds are normal. She exhibits no distension and no mass. There is no tenderness. There is no rebound and no guarding.  Musculoskeletal: Normal range of motion. She exhibits no edema and no tenderness.  Neurological: She is alert and oriented to person, place, and time.  Skin: Skin is warm and dry.  Psychiatric: She has a normal mood and affect.    ED Course  Procedures (including critical care time) Labs Review Labs Reviewed - No data to display Imaging Review Dg Chest 2 View  11/05/2013   CLINICAL DATA:  Cough, upper chest pain  EXAM: CHEST  2 VIEW  COMPARISON:  09/07/2012  FINDINGS: Remote calcified peripheral granuloma in the right lung. Borderline heart size but negative for CHF or focal pneumonia. No collapse or consolidation. No pleural effusion or pneumothorax. Trachea is midline. Atherosclerosis of the aorta. Degenerative changes of the spine. Stable exam.  IMPRESSION: Remote granulomatous disease.  No acute chest process.   Electronically Signed   By: Ruel Favors M.D.   On: 11/05/2013 08:26    EKG Interpretation   None       MDM   1. Acute bronchiolitis    Pt is a 73 y.o. female with Pmhx as above who presents with 1 week of productive cough, worse since last night. Seen by PCP for same 6 days ago, given mucinex & robitussin.  No fever, SOB, n/v, d/a, good PO intake.  Husband with similar symptoms. On PE, VSS, pt in NAD, well-hydrated appearing.  Slight crackles at bases.  CXR ordered and was negative for acute disease.  Doubt pna given no fever, nml O2 sat.  Suspect acute bronchitis.  Will add short course of steroids and recommend continued supportive care w/ mucinex, robitussin, and inc albuterol use.  Husband asked to f/u with PCP early this week.  Return precautions given for new or worsening symptoms including SOB, CP, inability to  tolerate liquids.          Shanna Cisco, MD 11/05/13 [phone]

## 2018-03-29 ENCOUNTER — Observation Stay (HOSPITAL_COMMUNITY)
Admission: EM | Admit: 2018-03-29 | Discharge: 2018-03-30 | Disposition: A | Discharge status: Home / Self Care | Payer: Medicare HMO | Attending: Internal Medicine | Admitting: Internal Medicine

## 2018-03-29 ENCOUNTER — Emergency Department (HOSPITAL_COMMUNITY): Payer: Medicare HMO

## 2018-03-29 ENCOUNTER — Encounter (HOSPITAL_COMMUNITY): Payer: Self-pay

## 2018-03-29 DIAGNOSIS — F0390 Unspecified dementia without behavioral disturbance: Secondary | ICD-10-CM | POA: Diagnosis not present

## 2018-03-29 DIAGNOSIS — Z7982 Long term (current) use of aspirin: Secondary | ICD-10-CM | POA: Insufficient documentation

## 2018-03-29 DIAGNOSIS — R55 Syncope and collapse: Secondary | ICD-10-CM | POA: Diagnosis present

## 2018-03-29 DIAGNOSIS — J3489 Other specified disorders of nose and nasal sinuses: Secondary | ICD-10-CM | POA: Insufficient documentation

## 2018-03-29 DIAGNOSIS — R4182 Altered mental status, unspecified: Secondary | ICD-10-CM | POA: Insufficient documentation

## 2018-03-29 DIAGNOSIS — K589 Irritable bowel syndrome without diarrhea: Secondary | ICD-10-CM | POA: Diagnosis not present

## 2018-03-29 DIAGNOSIS — Z7951 Long term (current) use of inhaled steroids: Secondary | ICD-10-CM | POA: Diagnosis not present

## 2018-03-29 DIAGNOSIS — G319 Degenerative disease of nervous system, unspecified: Secondary | ICD-10-CM | POA: Diagnosis not present

## 2018-03-29 DIAGNOSIS — E039 Hypothyroidism, unspecified: Secondary | ICD-10-CM | POA: Diagnosis not present

## 2018-03-29 DIAGNOSIS — M1990 Unspecified osteoarthritis, unspecified site: Secondary | ICD-10-CM | POA: Diagnosis not present

## 2018-03-29 DIAGNOSIS — Z79899 Other long term (current) drug therapy: Secondary | ICD-10-CM | POA: Insufficient documentation

## 2018-03-29 DIAGNOSIS — Z833 Family history of diabetes mellitus: Secondary | ICD-10-CM | POA: Diagnosis not present

## 2018-03-29 DIAGNOSIS — G5600 Carpal tunnel syndrome, unspecified upper limb: Secondary | ICD-10-CM | POA: Diagnosis not present

## 2018-03-29 DIAGNOSIS — J45909 Unspecified asthma, uncomplicated: Secondary | ICD-10-CM | POA: Diagnosis not present

## 2018-03-29 DIAGNOSIS — G4730 Sleep apnea, unspecified: Secondary | ICD-10-CM | POA: Insufficient documentation

## 2018-03-29 DIAGNOSIS — I081 Rheumatic disorders of both mitral and tricuspid valves: Secondary | ICD-10-CM | POA: Insufficient documentation

## 2018-03-29 DIAGNOSIS — E785 Hyperlipidemia, unspecified: Secondary | ICD-10-CM | POA: Insufficient documentation

## 2018-03-29 DIAGNOSIS — Z96651 Presence of right artificial knee joint: Secondary | ICD-10-CM | POA: Insufficient documentation

## 2018-03-29 DIAGNOSIS — I110 Hypertensive heart disease with heart failure: Secondary | ICD-10-CM | POA: Insufficient documentation

## 2018-03-29 DIAGNOSIS — Z9889 Other specified postprocedural states: Secondary | ICD-10-CM | POA: Diagnosis not present

## 2018-03-29 DIAGNOSIS — Z90710 Acquired absence of both cervix and uterus: Secondary | ICD-10-CM | POA: Insufficient documentation

## 2018-03-29 DIAGNOSIS — I6782 Cerebral ischemia: Secondary | ICD-10-CM | POA: Diagnosis not present

## 2018-03-29 DIAGNOSIS — I5030 Unspecified diastolic (congestive) heart failure: Secondary | ICD-10-CM | POA: Insufficient documentation

## 2018-03-29 LAB — URINALYSIS, ROUTINE W REFLEX MICROSCOPIC
Bilirubin Urine: NEGATIVE
Glucose, UA: NEGATIVE mg/dL
Hgb urine dipstick: NEGATIVE
Ketones, ur: 5 mg/dL — AB
LEUKOCYTES UA: NEGATIVE
NITRITE: NEGATIVE
Protein, ur: NEGATIVE mg/dL
SPECIFIC GRAVITY, URINE: 1.027 (ref 1.005–1.030)
pH: 5 (ref 5.0–8.0)

## 2018-03-29 LAB — COMPREHENSIVE METABOLIC PANEL
ALT: 19 U/L (ref 14–54)
AST: 35 U/L (ref 15–41)
Albumin: 3.9 g/dL (ref 3.5–5.0)
Alkaline Phosphatase: 81 U/L (ref 38–126)
Anion gap: 8 (ref 5–15)
BUN: 20 mg/dL (ref 6–20)
CO2: 26 mmol/L (ref 22–32)
CREATININE: 1.01 mg/dL — AB (ref 0.44–1.00)
Calcium: 9.8 mg/dL (ref 8.9–10.3)
Chloride: 109 mmol/L (ref 101–111)
GFR calc Af Amer: >60 mL/min (ref >60)
GFR, EST NON AFRICAN AMERICAN: 52 mL/min — AB (ref >60)
Glucose, Bld: 99 mg/dL (ref 65–99)
Potassium: 3.8 mmol/L (ref 3.5–5.1)
SODIUM: 143 mmol/L (ref 135–145)
Total Bilirubin: 1.1 mg/dL (ref 0.3–1.2)
Total Protein: 6.5 g/dL (ref 6.5–8.1)

## 2018-03-29 LAB — CBC WITH DIFFERENTIAL/PLATELET
ABS IMMATURE GRANULOCYTES: 0 10*3/uL (ref 0.0–0.1)
BASOS ABS: 0 10*3/uL (ref 0.0–0.1)
Basophils Relative: 0 %
Eosinophils Absolute: 0.1 10*3/uL (ref 0.0–0.7)
Eosinophils Relative: 1 %
HCT: 42.2 % (ref 36.0–46.0)
Hemoglobin: 13.2 g/dL (ref 12.0–15.0)
Immature Granulocytes: 0 %
LYMPHS PCT: 15 %
Lymphs Abs: 1.3 10*3/uL (ref 0.7–4.0)
MCH: 29.2 pg (ref 26.0–34.0)
MCHC: 31.3 g/dL (ref 30.0–36.0)
MCV: 93.4 fL (ref 78.0–100.0)
Monocytes Absolute: 0.5 10*3/uL (ref 0.1–1.0)
Monocytes Relative: 5 %
NEUTROS ABS: 6.4 10*3/uL (ref 1.7–7.7)
Neutrophils Relative %: 79 %
PLATELETS: 223 10*3/uL (ref 150–400)
RBC: 4.52 MIL/uL (ref 3.87–5.11)
RDW: 12.3 % (ref 11.5–15.5)
WBC: 8.3 10*3/uL (ref 4.0–10.5)

## 2018-03-29 LAB — BRAIN NATRIURETIC PEPTIDE: B Natriuretic Peptide: 52.6 pg/mL (ref 0.0–100.0)

## 2018-03-29 LAB — I-STAT TROPONIN, ED: Troponin i, poc: 0 ng/mL (ref 0.00–0.08)

## 2018-03-29 LAB — TSH: TSH: 3.437 u[IU]/mL (ref 0.350–4.500)

## 2018-03-29 LAB — LIPASE, BLOOD: Lipase: 34 U/L (ref 11–51)

## 2018-03-29 MED ORDER — ACETAMINOPHEN 650 MG RE SUPP: 650.0000 mg | Freq: Four times a day (QID) | RECTAL | Status: DC | PRN

## 2018-03-29 MED ORDER — SENNOSIDES-DOCUSATE SODIUM 8.6-50 MG PO TABS
1.0000 | ORAL_TABLET | Freq: Every evening | ORAL | Status: DC | PRN
  Administered 2018-03-30: 1 via ORAL
  Filled 2018-03-29: qty 1

## 2018-03-29 MED ORDER — ONDANSETRON HCL 4 MG/2ML IJ SOLN: 4.0000 mg | Freq: Four times a day (QID) | INTRAMUSCULAR | Status: DC | PRN

## 2018-03-29 MED ORDER — ATORVASTATIN CALCIUM 10 MG PO TABS
10.0000 mg | ORAL_TABLET | Freq: Every day | ORAL | Status: DC
  Administered 2018-03-30: 10 mg via ORAL
  Filled 2018-03-29: qty 1

## 2018-03-29 MED ORDER — ONDANSETRON HCL 4 MG PO TABS: 4.0000 mg | ORAL_TABLET | Freq: Four times a day (QID) | ORAL | Status: DC | PRN

## 2018-03-29 MED ORDER — POLYETHYLENE GLYCOL 3350 17 G PO PACK
17.0000 g | PACK | Freq: Every day | ORAL | Status: DC
  Administered 2018-03-30: 17 g via ORAL
  Filled 2018-03-29: qty 1

## 2018-03-29 MED ORDER — CALCIUM CARBONATE 1250 (500 CA) MG PO TABS
500.0000 mg | ORAL_TABLET | Freq: Every day | ORAL | Status: DC
  Administered 2018-03-30: 500 mg via ORAL
  Filled 2018-03-29: qty 1

## 2018-03-29 MED ORDER — DONEPEZIL HCL 10 MG PO TABS
10.0000 mg | ORAL_TABLET | Freq: Every day | ORAL | Status: DC
  Administered 2018-03-30: 10 mg via ORAL
  Filled 2018-03-29: qty 1

## 2018-03-29 MED ORDER — CYANOCOBALAMIN 500 MCG PO TABS
500.0000 ug | ORAL_TABLET | Freq: Every day | ORAL | Status: DC
  Administered 2018-03-30: 500 ug via ORAL
  Filled 2018-03-29: qty 1

## 2018-03-29 MED ORDER — LOSARTAN POTASSIUM 50 MG PO TABS
50.0000 mg | ORAL_TABLET | Freq: Every day | ORAL | Status: DC
Start: 2018-03-30 — End: 2018-03-30

## 2018-03-29 MED ORDER — SODIUM CHLORIDE 0.9% FLUSH
3.0000 mL | Freq: Two times a day (BID) | INTRAVENOUS | Status: DC
  Administered 2018-03-30 (×2): 3 mL via INTRAVENOUS

## 2018-03-29 MED ORDER — MOMETASONE FURO-FORMOTEROL FUM 200-5 MCG/ACT IN AERO
2.0000 | INHALATION_SPRAY | Freq: Two times a day (BID) | RESPIRATORY_TRACT | Status: DC
  Administered 2018-03-30: 2 via RESPIRATORY_TRACT
  Filled 2018-03-29: qty 8.8

## 2018-03-29 MED ORDER — ENOXAPARIN SODIUM 40 MG/0.4ML ~~LOC~~ SOLN: 40.0000 mg | SUBCUTANEOUS | Status: DC

## 2018-03-29 MED ORDER — FOLIC ACID 1 MG PO TABS
1.0000 mg | ORAL_TABLET | Freq: Every day | ORAL | Status: DC
Start: 2018-03-30 — End: 2018-03-30
  Administered 2018-03-30: 1 mg via ORAL
  Filled 2018-03-29: qty 1

## 2018-03-29 MED ORDER — LEVOTHYROXINE SODIUM 112 MCG PO TABS
112.0000 ug | ORAL_TABLET | Freq: Every day | ORAL | Status: DC
  Administered 2018-03-30: 112 ug via ORAL
  Filled 2018-03-29: qty 1

## 2018-03-29 MED ORDER — BISACODYL 5 MG PO TBEC: 5.0000 mg | DELAYED_RELEASE_TABLET | Freq: Every day | ORAL | Status: DC | PRN

## 2018-03-29 MED ORDER — ALUM & MAG HYDROXIDE-SIMETH 200-200-20 MG/5ML PO SUSP: 30.0000 mL | Freq: Four times a day (QID) | ORAL | Status: DC | PRN

## 2018-03-29 MED ORDER — ASPIRIN EC 81 MG PO TBEC
81.0000 mg | DELAYED_RELEASE_TABLET | Freq: Every day | ORAL | Status: DC
  Administered 2018-03-30: 81 mg via ORAL
  Filled 2018-03-29: qty 1

## 2018-03-29 MED ORDER — MONTELUKAST SODIUM 10 MG PO TABS
10.0000 mg | ORAL_TABLET | Freq: Every day | ORAL | Status: DC
  Administered 2018-03-30: 10 mg via ORAL
  Filled 2018-03-29: qty 1

## 2018-03-29 MED ORDER — SODIUM CHLORIDE 0.9 % IV BOLUS
1000.0000 mL | Freq: Once | INTRAVENOUS | Status: AC
  Administered 2018-03-29: 1000 mL via INTRAVENOUS

## 2018-03-29 MED ORDER — SODIUM CHLORIDE 0.9 % IV SOLN
INTRAVENOUS | Status: DC
  Administered 2018-03-30: 01:00:00 via INTRAVENOUS

## 2018-03-29 MED ORDER — ADULT MULTIVITAMIN W/MINERALS CH
1.0000 | ORAL_TABLET | Freq: Every day | ORAL | Status: DC
  Administered 2018-03-30: 1 via ORAL
  Filled 2018-03-29: qty 1

## 2018-03-29 MED ORDER — MAGNESIUM CITRATE PO SOLN
1.0000 | Freq: Once | ORAL | Status: DC | PRN
  Filled 2018-03-29: qty 296

## 2018-03-29 MED ORDER — MEMANTINE HCL 10 MG PO TABS
10.0000 mg | ORAL_TABLET | Freq: Every day | ORAL | Status: DC
  Administered 2018-03-30: 10 mg via ORAL
  Filled 2018-03-29: qty 1

## 2018-03-29 MED ORDER — VITAMIN C 500 MG PO TABS
1000.0000 mg | ORAL_TABLET | Freq: Every day | ORAL | Status: DC
  Administered 2018-03-30: 1000 mg via ORAL
  Filled 2018-03-29: qty 2

## 2018-03-29 MED ORDER — ACETAMINOPHEN 325 MG PO TABS: 650.0000 mg | ORAL_TABLET | Freq: Four times a day (QID) | ORAL | Status: DC | PRN

## 2018-03-29 NOTE — ED Notes (Signed)
Patient transported to X-ray 

## 2018-03-29 NOTE — ED Provider Notes (Signed)
Emergency Department Provider Note   I have reviewed the triage vital signs and the nursing notes.   HISTORY  Chief Complaint Altered Mental Status and Loss of Consciousness   HPI Kelsey Norris is a 77 y.o. female with PMH of HLD, Hypothyroidism, asthma, and severe dementia presents to the emergency department for evaluation after syncopal event.  The patient's husband provides the history due to the patient's underlying dementia.  They were running errands including going to the PCP, grocery shopping, going to pick up lunch.  When they returned home with her husband tried to help her out of the car but she became suddenly unresponsive.  She fell back into the car seat without head trauma.  Husband denies any seizure-like activity but states her eyes were rolling back in her head and he was unable to arouse her.  This went on for 1 to 2 minutes and then improved.  Paramedics were called in the interim who found the patient responsive to pain with hypotension.  Husband states that the patient had a syncope event last year and was admitted to [facility].  At that time, the patient's husband states that she was thought to be dehydrated from a blood pressure medication which was changed. No new medications.   Level 5 caveat: Dementia and Non-verbal at baseline.   Past Medical History:  Diagnosis Date  . Arthritis   . Asthma   . Carpal tunnel syndrome   . Dementia   . Elevated LFTs   . Helicobacter pylori gastritis   . Hiatal hernia   . Hyperlipidemia   . Hypothyroidism   . IBS (irritable bowel syndrome)   . Sleep apnea     Patient Active Problem List   Diagnosis Date Noted  . Syncope 03/29/2018  . Helicobacter pylori gastritis 02/25/2011  . WEIGHT LOSS 12/31/2010  . ABDOMINAL PAIN, UPPER 12/31/2010  . DEMENTIA 08/14/2009  . ASTHMA 08/14/2009  . CONSTIPATION 08/14/2009    Past Surgical History:  Procedure Laterality Date  . ABDOMINAL HYSTERECTOMY    . BREAST  LUMPECTOMY     left  . CARPAL TUNNEL RELEASE  1994   bi-lateral  . ear lymphectomy    . 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 THYROID SURGERY    . TONSILLECTOMY    . WRIST GANGLION EXCISION     left      Allergies Patient has no known allergies.  Family History  Problem Relation Age of Onset  . Diabetes Sister     Social History Social History   Tobacco Use  . Smoking status: Never Smoker  . Smokeless tobacco: Never Used  Substance Use Topics  . Alcohol use: No  . Drug use: No    Review of Systems  Level 5 caveat: Dementia.  ____________________________________________   PHYSICAL EXAM:  VITAL SIGNS: ED Triage Vitals  Enc Vitals Group     BP 03/29/18 1637 (!) 107/96     Pulse Rate 03/29/18 1637 70     Resp 03/29/18 1637 19     Temp 03/29/18 1640 97.9 F (36.6 C)     Temp Source 03/29/18 1640 Axillary     SpO2 03/29/18 1635 98 %   Constitutional: Alert but baseline confusion and garbled speech. No acute distress.  Eyes: Conjunctivae are normal. Head: Atraumatic. Nose: No congestion/rhinnorhea. Mouth/Throat: Mucous membranes are dry.  Neck: No stridor.  Cardiovascular: Normal rate, regular rhythm. Good peripheral circulation. Grossly normal heart sounds.   Respiratory:  Normal respiratory effort.  No retractions. Lungs CTAB. Gastrointestinal: Soft and nontender. No distention.  Musculoskeletal: No lower extremity tenderness nor edema. No gross deformities of extremities. Neurologic: Normal CN exam 2-12. No apparent unilateral weakness. Exam limited by patient's dementia and non-verbal status. Spontaneously moving all extremities.  Skin:  Skin is warm, dry and intact. No rash noted.  ____________________________________________   LABS (all labs ordered are listed, but only abnormal results are displayed)  Labs Reviewed  COMPREHENSIVE METABOLIC PANEL - Abnormal; Notable for the following components:      Result Value   Creatinine, Ser 1.01 (*)      GFR calc non Af Amer 52 (*)    All other components within normal limits  URINALYSIS, ROUTINE W REFLEX MICROSCOPIC - Abnormal; Notable for the following components:   Ketones, ur 5 (*)    All other components within normal limits  CBC - Abnormal; Notable for the following components:   Hemoglobin 11.4 (*)    All other components within normal limits  CREATININE, SERUM - Abnormal; Notable for the following components:   GFR calc non Af Amer 59 (*)    All other components within normal limits  BASIC METABOLIC PANEL - Abnormal; Notable for the following components:   Chloride 115 (*)    GFR calc non Af Amer 58 (*)    All other components within normal limits  GLUCOSE, CAPILLARY - Abnormal; Notable for the following components:   Glucose-Capillary 59 (*)    All other components within normal limits  URINE CULTURE  LIPASE, BLOOD  BRAIN NATRIURETIC PEPTIDE  TSH  CBC WITH DIFFERENTIAL/PLATELET  MAGNESIUM  PHOSPHORUS  CBC  TROPONIN I  TROPONIN I  CBC WITH DIFFERENTIAL/PLATELET  TROPONIN I  URINALYSIS, ROUTINE W REFLEX MICROSCOPIC  I-STAT TROPONIN, ED   ____________________________________________  EKG   EKG Interpretation  Date/Time:  Tuesday March 29 2018 16:32:44 EDT Ventricular Rate:  79 PR Interval:    QRS Duration: 88 QT Interval:  410 QTC Calculation: 470 R Axis:   13 Text Interpretation:  Sinus rhythm Borderline T wave abnormalities No STEMI.  Confirmed by Alona Bene [phone]) on 03/29/2018 4:39:30 PM       ____________________________________________  RADIOLOGY  Dg Chest 2 View  Result Date: 03/29/2018 CLINICAL DATA:  Syncopal episode today. EXAM: CHEST - 2 VIEW COMPARISON:  06/01/2017 FINDINGS: The heart size and mediastinal contours are within normal limits. Both lungs are clear. The visualized skeletal structures are unremarkable. IMPRESSION: No active cardiopulmonary disease. Electronically Signed   By: Myles Rosenthal M.D.   On: 03/29/2018 17:36   Ct Head Wo  Contrast  Result Date: 03/29/2018 CLINICAL DATA:  77 year old female with episode of unresponsiveness. History of dementia and high blood pressure. Initial encounter. EXAM: CT HEAD WITHOUT CONTRAST TECHNIQUE: Contiguous axial images were obtained from the base of the skull through the vertex without intravenous contrast. COMPARISON:  06/01/2017 head CT. FINDINGS: Brain: No intracranial hemorrhage or CT evidence of large acute infarct. Prominent global atrophy most notable involving the temporal and frontal lobes. Ventricular prominence felt to be related to atrophy rather hydrocephalus and unchanged. Chronic microvascular changes. No intracranial mass lesion noted on this unenhanced exam. Vascular: Vascular calcifications Skull: No acute abnormality Sinuses/Orbits: Orbits not imaged completely. Opacification right frontal sinus. Other: Mastoid air cells and middle ear cavities are clear. IMPRESSION: Exam is slightly limited by artifact. No intracranial hemorrhage or CT evidence of large acute infarct. Prominent global atrophy most notable involving the temporal and frontal lobes.  Chronic microvascular changes. Opacification right frontal sinus. Electronically Signed   By: Lacy Duverney M.D.   On: 03/29/2018 17:48    ____________________________________________   PROCEDURES  Procedure(s) performed:   Procedures  None ____________________________________________   INITIAL IMPRESSION / ASSESSMENT AND PLAN / ED COURSE  Pertinent labs & imaging results that were available during my care of the patient were reviewed by me and considered in my medical decision making (see chart for details).  Patient presents to the emergency department for evaluation of syncope.  No apparent seizure activity.  No head trauma.  On scene the patient was hypotensive with blood pressure improving slightly with 500 mL bolus.  The patient does appear somewhat dehydrated clinically.  Reviewed the patient's home medications  with no changes recently.  Plan for labs, UA, chest x-ray, CT head and reevaluate.   UA negative. Labs reviewed along with CXR and CT with no acute findings. Given history limitations with dementia and age plan for overnight observation for syncope.   Discussed patient's case with Hospitalist to request admission. Patient and family (if present) updated with plan. Care transferred to Hospitalist service.  I reviewed all nursing notes, vitals, pertinent old records, EKGs, labs, imaging (as available).  ____________________________________________  FINAL CLINICAL IMPRESSION(S) / ED DIAGNOSES  Final diagnoses:  Syncope, unspecified syncope type     MEDICATIONS GIVEN DURING THIS VISIT:  Medications  vitamin C (ASCORBIC ACID) tablet 1,000 mg (has no administration in time range)  aspirin EC tablet 81 mg (has no administration in time range)  atorvastatin (LIPITOR) tablet 10 mg (has no administration in time range)  mometasone-formoterol (DULERA) 200-5 MCG/ACT inhaler 2 puff (2 puffs Inhalation Given 03/30/18 0818)  calcium carbonate (OS-CAL - dosed in mg of elemental calcium) tablet 500 mg of elemental calcium (has no administration in time range)  donepezil (ARICEPT) tablet 10 mg (10 mg Oral Given 03/30/18 0054)  folic acid (FOLVITE) tablet 1 mg (has no administration in time range)  levothyroxine (SYNTHROID, LEVOTHROID) tablet 112 mcg (112 mcg Oral Given 03/30/18 0550)  losartan (COZAAR) tablet 50 mg (has no administration in time range)  memantine (NAMENDA) tablet 10 mg (has no administration in time range)  montelukast (SINGULAIR) tablet 10 mg (has no administration in time range)  multivitamin with minerals tablet 1 tablet (has no administration in time range)  polyethylene glycol (MIRALAX / GLYCOLAX) packet 17 g (has no administration in time range)  vitamin B-12 (CYANOCOBALAMIN) tablet 500 mcg (has no administration in time range)  enoxaparin (LOVENOX) injection 40 mg (has no  administration in time range)  sodium chloride flush (NS) 0.9 % injection 3 mL (3 mLs Intravenous Given 03/30/18 0055)  0.9 %  sodium chloride infusion ( Intravenous New Bag/Given 03/30/18 0053)  acetaminophen (TYLENOL) tablet 650 mg (has no administration in time range)    Or  acetaminophen (TYLENOL) suppository 650 mg (has no administration in time range)  senna-docusate (Senokot-S) tablet 1 tablet (1 tablet Oral Given 03/30/18 0054)  bisacodyl (DULCOLAX) EC tablet 5 mg (has no administration in time range)  magnesium citrate solution 1 Bottle (has no administration in time range)  ondansetron (ZOFRAN) tablet 4 mg (has no administration in time range)    Or  ondansetron (ZOFRAN) injection 4 mg (has no administration in time range)  alum & mag hydroxide-simeth (MAALOX/MYLANTA) 200-200-20 MG/5ML suspension 30 mL (has no administration in time range)  sodium chloride 0.9 % bolus 1,000 mL (0 mLs Intravenous Stopped 03/29/18 1813)  dextrose 50 % solution 25  mL (25 mLs Intravenous Given 03/30/18 0834)    Note:  This document was prepared using Dragon voice recognition software and may include unintentional dictation errors.  Alona BeneJoshua Reniyah Gootee, MD Emergency Medicine    Sione Baumgarten, Arlyss RepressJoshua G, MD 03/30/18 [phone]

## 2018-03-29 NOTE — ED Notes (Signed)
Admitting, MD at bedside.  

## 2018-03-29 NOTE — ED Triage Notes (Signed)
Pt presents to ED from home via EMS after witnessed LOC by husband. Pt has advanced dementia and is poor historian. Husband reported to EMS after shopping he was attempting to get her out of car when she laid back, eyes rolled back in head and she was unresponsive. When ems arrived pt was responsive to pain, bp 80/60. Pt received 500cc ns. Pt alert but non verbal. #22 LW

## 2018-03-29 NOTE — H&P (Signed)
History and Physical   [facility] [place] [facility] [place] Admission History and Physical [employer] Shenell Rogalski, D.O.    Patient Name: Kelsey PeelingBeverly Rayner MR#: [id number] Date of Birth: 01-05-1941 Date of Admission: 03/29/2018  Referring MD/NP/PA: Dr. Jacqulyn BathLong Primary Care Physician: Tracey HarriesBouska, David, MD  Chief Complaint:  Chief Complaint  Patient presents with  . Altered Mental Status  . Loss of Consciousness  Please note the entire history is obtained from the patient's emergency department chart, emergency department provider and the patient's family who is at the bedside. Patient's personal history is limited by dementia.   HPI: Kelsey Norris is a 77 y.o. female with a known history of HLD, asthma, severe dementia presents to the emergency department for evaluation of AMS, syncope.  Patient was in a usual state of health until this afternoon, her husband had taken her out to run errands, when she became unresponsive while he was taking her out of the car.  Regained consciousness spontaneously.  Patient offers no history.    Otherwise there has been no change in status. Patient has been taking medication as prescribed and there has been no recent change in medication or diet.  No recent antibiotics.  There has been no recent illness, hospitalizations, travel or sick contacts.    EMS/ED Course: Patient received IV fluids. Medical admission has been requested for further management of syncope.  Review of Systems:  Patient offers no history 2/2 dementia  Past Medical History:  Diagnosis Date  . Arthritis   . Asthma   . Carpal tunnel syndrome   . Dementia   . Elevated LFTs   . Helicobacter pylori gastritis   . Hiatal hernia   . Hyperlipidemia   . Hypothyroidism   . IBS (irritable bowel syndrome)   . Sleep apnea     Past Surgical History:  Procedure Laterality Date  . ABDOMINAL HYSTERECTOMY    . BREAST LUMPECTOMY     left  . CARPAL TUNNEL RELEASE  1994   bi-lateral  . ear  lymphectomy    . 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 THYROID SURGERY    . TONSILLECTOMY    . WRIST GANGLION EXCISION     left     reports that she has never smoked. She has never used smokeless tobacco. She reports that she does not drink alcohol or use drugs.  No Known Allergies  Family History  Problem Relation Age of Onset  . Diabetes Sister     Prior to Admission medications   Medication Sig Start Date End Date Taking? Authorizing Provider  acetaminophen (TYLENOL) 500 MG tablet Take 500 mg by mouth every 8 (eight) hours as needed for mild pain.   Yes [provider]  Ascorbic Acid (VITAMIN C) 1000 MG tablet Take 1,000 mg by mouth daily.     Yes [provider]  aspirin 81 MG tablet Take 81 mg by mouth daily.    Yes [provider]  atorvastatin (LIPITOR) 10 MG tablet Take 10 mg by mouth daily.   Yes [provider]  budesonide-formoterol (SYMBICORT) 160-4.5 MCG/ACT inhaler Inhale 2 puffs into the lungs 2 (two) times daily.   Yes [provider]  calcium carbonate (OSCAL) 1500 (600 Ca) MG TABS tablet Take 600 mg of elemental calcium by mouth daily with breakfast.   Yes [provider]  donepezil (ARICEPT) 10 MG tablet Take 10 mg by mouth at bedtime.    Yes [provider]  folic  acid (FOLVITE) 1 MG tablet Take 1 mg by mouth daily.   Yes [provider]  levothyroxine (LEVOXYL) 112 MCG tablet Take 112 mcg by mouth daily.    Yes [provider]  losartan (COZAAR) 50 MG tablet Take 50 mg by mouth daily.   Yes [provider]  memantine (NAMENDA) 10 MG tablet Take 10 mg by mouth daily.    Yes [provider]  montelukast (SINGULAIR) 10 MG tablet Take 10 mg by mouth daily.    Yes [provider]  Multiple Vitamin (MULTIVITAMIN) tablet Take 1 tablet by mouth daily.    Yes [provider]  polyethylene glycol (MIRALAX / GLYCOLAX) packet Take 17 g by mouth daily.      Yes [provider]  vitamin B-12 (CYANOCOBALAMIN) 500 MCG tablet Take 500 mcg by mouth daily.    Yes [provider]  predniSONE (DELTASONE) 20 MG tablet 3 tabs po day one, then 2 po daily x 4 days Patient not taking: Reported on 03/29/2018 11/05/13   Toy Cookey, MD    Physical Exam: Vitals:   03/29/18 1640 03/29/18 1845 03/29/18 1900 03/29/18 2100  BP:  (!) 148/68 (!) 147/74 127/65  Pulse:  76 85 70  Resp:  20 17 18   Temp: 97.9 F (36.6 C)     TempSrc: Axillary     SpO2:  99% 99% 100%    GENERAL: 77 y.o.-year-old female patient, well-developed, well-nourished lying in the bed in no acute distress. Pleasantly confused.  Mumbling, not making meaningful interactions.  HEENT: Head atraumatic, normocephalic. Pupils equal. Mucus membranes dry NECK: Supple. No JVD. CHEST: Normal breath sounds bilaterally. No wheezing, rales, rhonchi or crackles. No use of accessory muscles of respiration.  No reproducible chest wall tenderness.  CARDIOVASCULAR: S1, S2 normal. No murmurs, rubs, or gallops. Cap refill <2 seconds. Pulses intact distally.  ABDOMEN: Soft, nondistended, nontender. No rebound, guarding, rigidity. Normoactive bowel sounds present in all four quadrants.  EXTREMITIES: No pedal edema, cyanosis, or clubbing. No calf tenderness or Homan's sign.  NEUROLOGIC: The patient does not follow commands, smiles and nods, but does not interact meaningfully.  SKIN: Warm, dry, and intact without obvious rash, lesion, or ulcer.    Labs on Admission:  CBC: Recent Labs  Lab 03/29/18 1954  WBC 8.3  NEUTROABS 6.4  HGB 13.2  HCT 42.2  MCV 93.4  PLT 223   Basic Metabolic Panel: Recent Labs  Lab 03/29/18 1800  NA 143  K 3.8  CL 109  CO2 26  GLUCOSE 99  BUN 20  CREATININE 1.01*  CALCIUM 9.8   GFR: CrCl cannot be calculated (Unknown ideal weight.). Liver Function Tests: Recent Labs  Lab 03/29/18 1800  AST 35  ALT 19  ALKPHOS 81  BILITOT 1.1  PROT 6.5   ALBUMIN 3.9   Recent Labs  Lab 03/29/18 1800  LIPASE 34   No results for input(s): AMMONIA in the last 168 hours. Coagulation Profile: No results for input(s): INR, PROTIME in the last 168 hours. Cardiac Enzymes: No results for input(s): CKTOTAL, CKMB, CKMBINDEX, TROPONINI in the last 168 hours. BNP (last 3 results) No results for input(s): PROBNP in the last 8760 hours. HbA1C: No results for input(s): HGBA1C in the last 72 hours. CBG: No results for input(s): GLUCAP in the last 168 hours. Lipid Profile: No results for input(s): CHOL, HDL, LDLCALC, TRIG, CHOLHDL, LDLDIRECT in the last 72 hours. Thyroid Function Tests: Recent Labs    03/29/18 1800  TSH  3.437   Anemia Panel: No results for input(s): VITAMINB12, FOLATE, FERRITIN, TIBC, IRON, RETICCTPCT in the last 72 hours. Urine analysis:    Component Value Date/Time   COLORURINE YELLOW 03/29/2018 1858   APPEARANCEUR CLEAR 03/29/2018 1858   LABSPEC 1.027 03/29/2018 1858   PHURINE 5.0 03/29/2018 1858   GLUCOSEU NEGATIVE 03/29/2018 1858   HGBUR NEGATIVE 03/29/2018 1858   BILIRUBINUR NEGATIVE 03/29/2018 1858   KETONESUR 5 (A) 03/29/2018 1858   PROTEINUR NEGATIVE 03/29/2018 1858   UROBILINOGEN 0.2 12/18/2010 1335   NITRITE NEGATIVE 03/29/2018 1858   LEUKOCYTESUR NEGATIVE 03/29/2018 1858   Sepsis Labs: @LABRCNTIP (procalcitonin:4,lacticidven:4) )No results found for this or any previous visit (from the past 240 hour(s)).   Radiological Exams on Admission: Dg Chest 2 View  Result Date: 03/29/2018 CLINICAL DATA:  Syncopal episode today. EXAM: CHEST - 2 VIEW COMPARISON:  06/01/2017 FINDINGS: The heart size and mediastinal contours are within normal limits. Both lungs are clear. The visualized skeletal structures are unremarkable. IMPRESSION: No active cardiopulmonary disease. Electronically Signed   By: Myles Rosenthal M.D.   On: 03/29/2018 17:36   Ct Head Wo Contrast  Result Date: 03/29/2018 CLINICAL DATA:  77 year old female  with episode of unresponsiveness. History of dementia and high blood pressure. Initial encounter. EXAM: CT HEAD WITHOUT CONTRAST TECHNIQUE: Contiguous axial images were obtained from the base of the skull through the vertex without intravenous contrast. COMPARISON:  06/01/2017 head CT. FINDINGS: Brain: No intracranial hemorrhage or CT evidence of large acute infarct. Prominent global atrophy most notable involving the temporal and frontal lobes. Ventricular prominence felt to be related to atrophy rather hydrocephalus and unchanged. Chronic microvascular changes. No intracranial mass lesion noted on this unenhanced exam. Vascular: Vascular calcifications Skull: No acute abnormality Sinuses/Orbits: Orbits not imaged completely. Opacification right frontal sinus. Other: Mastoid air cells and middle ear cavities are clear. IMPRESSION: Exam is slightly limited by artifact. No intracranial hemorrhage or CT evidence of large acute infarct. Prominent global atrophy most notable involving the temporal and frontal lobes. Chronic microvascular changes. Opacification right frontal sinus. Electronically Signed   By: Lacy Duverney M.D.   On: 03/29/2018 17:48    EKG: Normal sinus rhythm at 79 bpm with normal axis and nonspecific ST-T wave changes.   Assessment/Plan  This is a 77 y.o. female with a history of  HLD, asthma, severe dementia  now being admitted with:  #. Syncope, possibly dehydration - Admit observation with telemetry monitoring - IV fluid hydration - Check orthostatics - Check echo, carotids - Trend trops, check TSH, lipids - Consider cardio consult  #. History of HLD - Continue Lipitor  #. History of asthma - Continue Symbicort, Singulair  #. History of dementia - Continue Aricept, Namenda  #. History of HTN - Continue Cozaar,  Admission status: Tele obs IV Fluids: NS Diet/Nutrition: Heart healthy Consults called: None  DVT Px: Lovenox, SCDs and early ambulation. Code Status: Full  Code  Disposition Plan: To home in 1-2 days  All the records are reviewed and case discussed with ED provider. Management plans discussed with the patient and/or family who express understanding and agree with plan of care.  Solveig Fangman D.O. on 03/29/2018 at 9:42 PM CC: Primary care physician; Tracey Harries, MD   03/29/2018, 9:42 PM

## 2018-03-30 ENCOUNTER — Observation Stay (HOSPITAL_BASED_OUTPATIENT_CLINIC_OR_DEPARTMENT_OTHER): Payer: Medicare HMO

## 2018-03-30 ENCOUNTER — Other Ambulatory Visit: Payer: Self-pay

## 2018-03-30 DIAGNOSIS — I340 Nonrheumatic mitral (valve) insufficiency: Secondary | ICD-10-CM

## 2018-03-30 DIAGNOSIS — R55 Syncope and collapse: Secondary | ICD-10-CM

## 2018-03-30 LAB — BASIC METABOLIC PANEL
ANION GAP: 6 (ref 5–15)
BUN: 14 mg/dL (ref 6–20)
CALCIUM: 9.3 mg/dL (ref 8.9–10.3)
CO2: 22 mmol/L (ref 22–32)
Chloride: 115 mmol/L — ABNORMAL HIGH (ref 101–111)
Creatinine, Ser: 0.93 mg/dL (ref 0.44–1.00)
GFR, EST NON AFRICAN AMERICAN: 58 mL/min — AB (ref >60)
Glucose, Bld: 81 mg/dL (ref 65–99)
POTASSIUM: 3.7 mmol/L (ref 3.5–5.1)
SODIUM: 143 mmol/L (ref 135–145)

## 2018-03-30 LAB — CBC
HEMATOCRIT: 36.8 % (ref 36.0–46.0)
HEMATOCRIT: 39.5 % (ref 36.0–46.0)
HEMOGLOBIN: 11.4 g/dL — AB (ref 12.0–15.0)
HEMOGLOBIN: 12.3 g/dL (ref 12.0–15.0)
MCH: 29.3 pg (ref 26.0–34.0)
MCH: 29.1 pg (ref 26.0–34.0)
MCHC: 31.1 g/dL (ref 30.0–36.0)
MCHC: 31 g/dL (ref 30.0–36.0)
MCV: 94 fL (ref 78.0–100.0)
MCV: 93.9 fL (ref 78.0–100.0)
Platelets: 182 10*3/uL (ref 150–400)
Platelets: 192 10*3/uL (ref 150–400)
RBC: 3.92 MIL/uL (ref 3.87–5.11)
RBC: 4.2 MIL/uL (ref 3.87–5.11)
RDW: 12.3 % (ref 11.5–15.5)
RDW: 12.4 % (ref 11.5–15.5)
WBC: 6.6 10*3/uL (ref 4.0–10.5)
WBC: 7 10*3/uL (ref 4.0–10.5)

## 2018-03-30 LAB — CREATININE, SERUM
Creatinine, Ser: 0.91 mg/dL (ref 0.44–1.00)
GFR calc Af Amer: >60 mL/min (ref >60)
GFR calc non Af Amer: 59 mL/min — ABNORMAL LOW (ref >60)

## 2018-03-30 LAB — ECHOCARDIOGRAM COMPLETE
HEIGHTINCHES: 60 in
WEIGHTICAEL: 2335.11 [oz_av]

## 2018-03-30 LAB — TROPONIN I

## 2018-03-30 LAB — GLUCOSE, CAPILLARY
Glucose-Capillary: 59 mg/dL — ABNORMAL LOW (ref 65–99)
Glucose-Capillary: 106 mg/dL — ABNORMAL HIGH (ref 65–99)
Glucose-Capillary: 72 mg/dL (ref 65–99)

## 2018-03-30 LAB — URINE CULTURE: Culture: NO GROWTH

## 2018-03-30 LAB — MAGNESIUM: Magnesium: 2.2 mg/dL (ref 1.7–2.4)

## 2018-03-30 LAB — PHOSPHORUS: PHOSPHORUS: 3.6 mg/dL (ref 2.5–4.6)

## 2018-03-30 MED ORDER — LOSARTAN POTASSIUM 25 MG PO TABS: 25.0000 mg | ORAL_TABLET | Freq: Every day | ORAL | Status: DC

## 2018-03-30 MED ORDER — LOSARTAN POTASSIUM 25 MG PO TABS
25.0000 mg | ORAL_TABLET | Freq: Every day | ORAL | Status: DC
  Administered 2018-03-30: 25 mg via ORAL
  Filled 2018-03-30: qty 1

## 2018-03-30 MED ORDER — DEXTROSE 50 % IV SOLN
INTRAVENOUS | Status: AC
  Administered 2018-03-30: 25 mL via INTRAVENOUS
  Filled 2018-03-30: qty 50

## 2018-03-30 MED ORDER — DEXTROSE 50 % IV SOLN
25.0000 mL | Freq: Once | INTRAVENOUS | Status: AC
  Administered 2018-03-30: 25 mL via INTRAVENOUS

## 2018-03-30 NOTE — Progress Notes (Signed)
Pt arrived to the unit accompanied by spouse. Pt is alert/disoriented and  unable to follow commands. Pt's spouse answered the admission questions and was educated on safety, how to call for assistance, plan of care and self-care activities. Skin warm and dry. Call bell within reach. Will continue to monitor patient.

## 2018-03-30 NOTE — Discharge Summary (Signed)
Physician Discharge Summary  Kelsey Norris WUJ:[id number] DOB: [birth year] DOA: 03/29/2018  PCP: Tracey Harries, MD  Admit date: 03/29/2018 Discharge date: 03/30/2018  Admitted From: home Discharge disposition: *home   Recommendations for Outpatient Follow-Up:   1. Encourage hydration 2. Allow for higher BP (130-150)-- have cut BP medication in half 3. ? Palliative care referral   Discharge Diagnosis:   Active Problems:   Syncope    Discharge Condition: Improved.  Diet recommendation: Low sodium, heart healthy  Wound care: None.  Code status: Full.   History of Present Illness:   Kelsey Norris is a 77 y.o. female with a known history of HLD, asthma, severe dementia presents to the emergency department for evaluation of AMS, syncope.  Patient was in a usual state of health until this afternoon, her husband had taken her out to run errands, when she became unresponsive while he was taking her out of the car.  Regained consciousness spontaneously.  Patient offers no history.    Otherwise there has been no change in status. Patient has been taking medication as prescribed and there has been no recent change in medication or diet.  No recent antibiotics.  There has been no recent illness, hospitalizations, travel or sick contacts.       Hospital Course by Problem:   Syncope, probable  Orthostatic/ dehydration - IV fluid hydration - Check orthostatics negative after 1L IVF - echo w/o change -had EEG and carotids in July 2018 when seen for similar presentation at [facility]  History of HLD - Continue Lipitor  History of asthma - Continue Symbicort, Singulair   History of dementia - Continue Aricept, Namenda -husband says patient is at her baseline  History of HTN - Continue Cozaar but at 1/2 dose, allow for permissive HTN (130-150)      Medical Consultants:      Discharge Exam:   Vitals:   03/30/18 0820 03/30/18 1149  BP:  135/88  Pulse:  65    Resp:  18  Temp:  97.7 F (36.5 C)  SpO2: 100% 90%   Vitals:   03/29/18 2322 03/30/18 0531 03/30/18 0820 03/30/18 1149  BP: 136/67 (!) 146/68  135/88  Pulse: 72 67  65  Resp: 18 18  18   Temp: 98.5 F (36.9 C) 98.2 F (36.8 C)  97.7 F (36.5 C)  TempSrc: Oral Oral  Oral  SpO2: 100% 100% 100% 90%  Weight: 65.6 kg (144 lb 10 oz) 66.2 kg (145 lb 15.1 oz)    Height: 5' (1.524 m)       General exam: Appears calm and comfortable. Back to her baseline   The results of significant diagnostics from this hospitalization (including imaging, microbiology, ancillary and laboratory) are listed below for reference.     Procedures and Diagnostic Studies:   Dg Chest 2 View  Result Date: 03/29/2018 CLINICAL DATA:  Syncopal episode today. EXAM: CHEST - 2 VIEW COMPARISON:  06/01/2017 FINDINGS: The heart size and mediastinal contours are within normal limits. Both lungs are clear. The visualized skeletal structures are unremarkable. IMPRESSION: No active cardiopulmonary disease. Electronically Signed   By: Myles Rosenthal M.D.   On: 03/29/2018 17:36   Ct Head Wo Contrast  Result Date: 03/29/2018 CLINICAL DATA:  77 year old female with episode of unresponsiveness. History of dementia and high blood pressure. Initial encounter. EXAM: CT HEAD WITHOUT CONTRAST TECHNIQUE: Contiguous axial images were obtained from the base of the skull through the vertex without intravenous contrast. COMPARISON:  06/01/2017  head CT. FINDINGS: Brain: No intracranial hemorrhage or CT evidence of large acute infarct. Prominent global atrophy most notable involving the temporal and frontal lobes. Ventricular prominence felt to be related to atrophy rather hydrocephalus and unchanged. Chronic microvascular changes. No intracranial mass lesion noted on this unenhanced exam. Vascular: Vascular calcifications Skull: No acute abnormality Sinuses/Orbits: Orbits not imaged completely. Opacification right frontal sinus. Other: Mastoid air  cells and middle ear cavities are clear. IMPRESSION: Exam is slightly limited by artifact. No intracranial hemorrhage or CT evidence of large acute infarct. Prominent global atrophy most notable involving the temporal and frontal lobes. Chronic microvascular changes. Opacification right frontal sinus. Electronically Signed   By: Lacy DuverneySteven  Olson M.D.   On: 03/29/2018 17:48     Labs:   Basic Metabolic Panel: Recent Labs  Lab 03/29/18 1800 03/30/18 0025 03/30/18 0521  NA 143  --  143  K 3.8  --  3.7  CL 109  --  115*  CO2 26  --  22  GLUCOSE 99  --  81  BUN 20  --  14  CREATININE 1.01* 0.91 0.93  CALCIUM 9.8  --  9.3  MG  --  2.2  --   PHOS  --  3.6  --    GFR Estimated Creatinine Clearance: 43 mL/min (by C-G formula based on SCr of 0.93 mg/dL). Liver Function Tests: Recent Labs  Lab 03/29/18 1800  AST 35  ALT 19  ALKPHOS 81  BILITOT 1.1  PROT 6.5  ALBUMIN 3.9   Recent Labs  Lab 03/29/18 1800  LIPASE 34   No results for input(s): AMMONIA in the last 168 hours. Coagulation profile No results for input(s): INR, PROTIME in the last 168 hours.  CBC: Recent Labs  Lab 03/29/18 1954 03/30/18 0025 03/30/18 0521  WBC 8.3 7.0 6.6  NEUTROABS 6.4  --   --   HGB 13.2 11.4* 12.3  HCT 42.2 36.8 39.5  MCV 93.4 93.9 94.0  PLT 223 182 192   Cardiac Enzymes: Recent Labs  Lab 03/30/18 0025 03/30/18 0521 03/30/18 1052  TROPONINI <0.03 <0.03 <0.03   BNP: Invalid input(s): POCBNP CBG: Recent Labs  Lab 03/30/18 0726 03/30/18 0854 03/30/18 1147  GLUCAP 59* 106* 72   D-Dimer No results for input(s): DDIMER in the last 72 hours. Hgb A1c No results for input(s): HGBA1C in the last 72 hours. Lipid Profile No results for input(s): CHOL, HDL, LDLCALC, TRIG, CHOLHDL, LDLDIRECT in the last 72 hours. Thyroid function studies Recent Labs    03/29/18 1800  TSH 3.437   Anemia work up No results for input(s): VITAMINB12, FOLATE, FERRITIN, TIBC, IRON, RETICCTPCT in the  last 72 hours. Microbiology Recent Results (from the past 240 hour(s))  Urine culture     Status: None   Collection Time: 03/29/18  6:58 PM  Result Value Ref Range Status   Specimen Description URINE, CATHETERIZED  Final   Special Requests NONE  Final   Culture   Final    NO GROWTH Performed at [facility], [address] [place], [place] [address]    Report Status 03/30/2018 FINAL  Final     Discharge Instructions:   Discharge Instructions    Diet - low sodium heart healthy   Complete by:  As directed    Discharge instructions   Complete by:  As directed    Encourage hydration/ frequent snacks   Increase activity slowly   Complete by:  As directed  Allergies as of 03/30/2018   No Known Allergies     Medication List    STOP taking these medications   predniSONE 20 MG tablet Commonly known as:  DELTASONE     TAKE these medications   acetaminophen 500 MG tablet Commonly known as:  TYLENOL Take 500 mg by mouth every 8 (eight) hours as needed for mild pain.   aspirin 81 MG tablet Take 81 mg by mouth daily.   atorvastatin 10 MG tablet Commonly known as:  LIPITOR Take 10 mg by mouth daily.   budesonide-formoterol 160-4.5 MCG/ACT inhaler Commonly known as:  SYMBICORT Inhale 2 puffs into the lungs 2 (two) times daily.   calcium carbonate 1500 (600 Ca) MG Tabs tablet Commonly known as:  OSCAL Take 600 mg of elemental calcium by mouth daily with breakfast.   donepezil 10 MG tablet Commonly known as:  ARICEPT Take 10 mg by mouth at bedtime.   folic acid 1 MG tablet Commonly known as:  FOLVITE Take 1 mg by mouth daily.   LEVOXYL 112 MCG tablet Generic drug:  levothyroxine Take 112 mcg by mouth daily.   losartan 25 MG tablet Commonly known as:  COZAAR Take 1 tablet (25 mg total) by mouth daily. Start taking on:  03/31/2018 What changed:    medication strength  how much to take   memantine 10 MG tablet Commonly known as:  NAMENDA Take 10 mg  by mouth daily.   montelukast 10 MG tablet Commonly known as:  SINGULAIR Take 10 mg by mouth daily.   multivitamin tablet Take 1 tablet by mouth daily.   polyethylene glycol packet Commonly known as:  MIRALAX / GLYCOLAX Take 17 g by mouth daily.   vitamin B-12 500 MCG tablet Commonly known as:  CYANOCOBALAMIN Take 500 mcg by mouth daily.   vitamin C 1000 MG tablet Take 1,000 mg by mouth daily.      Follow-up Information    Tracey Harries, MD Follow up in 1 week(s).   Specialty:  Family Medicine Why:  BP check Contact information: [address] [place] [address] [phone]            Time coordinating discharge: 35 min  Signed:  Joseph Art  [facility] 03/30/2018, 3:58 PM

## 2018-03-30 NOTE — Progress Notes (Signed)
  Echocardiogram 2D Echocardiogram has been performed.  Kelsey Norris  Kelsey Norris 03/30/2018, 11:06 AM

## 2018-03-30 NOTE — Progress Notes (Signed)
Discharge instructions given to husband and all questions answered.

## 2018-03-30 NOTE — Progress Notes (Signed)
Patient CBG this morning 59.  Dr. Benjamine MolaVann made aware.  Ordered 1/2 amp D50 and changed diet to regular.  Patient given apple juice.  CBG recheck 106.  Will continue to monitor.

## 2018-03-30 NOTE — Progress Notes (Signed)
Patient ambulated in hallway approximately 100 feet with minimal assistance. She needed help to get out of bed, but once up, she was steady.  Husband states this is her baseline.  Patient tolerated well.

## 2018-03-30 NOTE — Progress Notes (Signed)
Checked pulses per MD order.  Right and left radial 2+ bilaterally.  Left dorsalis pedis 2+.  Right dorsalis pedis found with doppler.

## 2018-05-28 ENCOUNTER — Other Ambulatory Visit: Payer: Self-pay

## 2018-05-28 ENCOUNTER — Encounter (HOSPITAL_BASED_OUTPATIENT_CLINIC_OR_DEPARTMENT_OTHER): Payer: Self-pay | Admitting: *Deleted

## 2018-05-28 ENCOUNTER — Emergency Department (HOSPITAL_BASED_OUTPATIENT_CLINIC_OR_DEPARTMENT_OTHER): Payer: Medicare HMO

## 2018-05-28 ENCOUNTER — Emergency Department (HOSPITAL_BASED_OUTPATIENT_CLINIC_OR_DEPARTMENT_OTHER)
Admission: EM | Admit: 2018-05-28 | Discharge: 2018-05-28 | Disposition: A | Discharge status: Home / Self Care | Payer: Medicare HMO | Attending: Emergency Medicine | Admitting: Emergency Medicine

## 2018-05-28 DIAGNOSIS — W01198A Fall on same level from slipping, tripping and stumbling with subsequent striking against other object, initial encounter: Secondary | ICD-10-CM | POA: Diagnosis not present

## 2018-05-28 DIAGNOSIS — E039 Hypothyroidism, unspecified: Secondary | ICD-10-CM | POA: Insufficient documentation

## 2018-05-28 DIAGNOSIS — S0990XA Unspecified injury of head, initial encounter: Secondary | ICD-10-CM | POA: Diagnosis present

## 2018-05-28 DIAGNOSIS — Z7982 Long term (current) use of aspirin: Secondary | ICD-10-CM | POA: Insufficient documentation

## 2018-05-28 DIAGNOSIS — Z79899 Other long term (current) drug therapy: Secondary | ICD-10-CM | POA: Insufficient documentation

## 2018-05-28 DIAGNOSIS — S0101XA Laceration without foreign body of scalp, initial encounter: Secondary | ICD-10-CM | POA: Insufficient documentation

## 2018-05-28 DIAGNOSIS — J45909 Unspecified asthma, uncomplicated: Secondary | ICD-10-CM | POA: Diagnosis not present

## 2018-05-28 DIAGNOSIS — Y92009 Unspecified place in unspecified non-institutional (private) residence as the place of occurrence of the external cause: Secondary | ICD-10-CM | POA: Diagnosis not present

## 2018-05-28 DIAGNOSIS — Y9389 Activity, other specified: Secondary | ICD-10-CM | POA: Insufficient documentation

## 2018-05-28 DIAGNOSIS — Z96651 Presence of right artificial knee joint: Secondary | ICD-10-CM | POA: Insufficient documentation

## 2018-05-28 DIAGNOSIS — Y998 Other external cause status: Secondary | ICD-10-CM | POA: Insufficient documentation

## 2018-05-28 DIAGNOSIS — F0390 Unspecified dementia without behavioral disturbance: Secondary | ICD-10-CM | POA: Insufficient documentation

## 2018-05-28 NOTE — ED Provider Notes (Signed)
[facility] EMERGENCY DEPARTMENT Provider Note   CSN: [id number] Arrival date & time: 05/28/18  1953     History   Chief Complaint Chief Complaint  Patient presents with  . Laceration    HPI Kelsey Norris is a 77 y.o. female history dementia, hyperlipidemia here presenting with fall.  Patient lives at home with her husband but is demented at baseline.  Husband was walking with her and her leg gave out and she hit her head the wall around 12:30 PM.  She has some bleeding that was controlled.  Husband states that her mental status at baseline and she does not talk at baseline due to her dementia.  Husband tried to put her to sleep and the bleeding restarted so he brought her in for evaluation.  Tdap up to date   The history is provided by a caregiver and the spouse.   Level V caveat- dementia   Past Medical History:  Diagnosis Date  . Arthritis   . Asthma   . Carpal tunnel syndrome   . Dementia   . Elevated LFTs   . Helicobacter pylori gastritis   . Hiatal hernia   . Hyperlipidemia   . Hypothyroidism   . IBS (irritable bowel syndrome)   . Sleep apnea     Patient Active Problem List   Diagnosis Date Noted  . Syncope 03/29/2018  . Helicobacter pylori gastritis 02/25/2011  . WEIGHT LOSS 12/31/2010  . ABDOMINAL PAIN, UPPER 12/31/2010  . DEMENTIA 08/14/2009  . ASTHMA 08/14/2009  . CONSTIPATION 08/14/2009    Past Surgical History:  Procedure Laterality Date  . ABDOMINAL HYSTERECTOMY    . BREAST LUMPECTOMY     left  . CARPAL TUNNEL RELEASE  1994   bi-lateral  . ear lymphectomy    . 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 THYROID SURGERY    . TONSILLECTOMY    . WRIST GANGLION EXCISION     left     OB History   None      Home Medications    Prior to Admission medications   Medication Sig Start Date End Date Taking? Authorizing Provider  acetaminophen (TYLENOL) 500 MG tablet Take 500 mg by mouth every 8 (eight) hours as needed for mild pain.    Yes [provider]  Ascorbic Acid (VITAMIN C) 1000 MG tablet Take 1,000 mg by mouth daily.     Yes [provider]  aspirin 81 MG tablet Take 81 mg by mouth daily.    Yes [provider]  atorvastatin (LIPITOR) 10 MG tablet Take 10 mg by mouth daily.   Yes [provider]  budesonide-formoterol (SYMBICORT) 160-4.5 MCG/ACT inhaler Inhale 2 puffs into the lungs 2 (two) times daily.   Yes [provider]  calcium carbonate (OSCAL) 1500 (600 Ca) MG TABS tablet Take 600 mg of elemental calcium by mouth daily with breakfast.   Yes [provider]  cetirizine (ZYRTEC) 10 MG tablet Take 10 mg by mouth daily.   Yes [provider]  donepezil (ARICEPT) 10 MG tablet Take 10 mg by mouth at bedtime.    Yes [provider]  folic acid (FOLVITE) 1 MG tablet Take 1 mg by mouth daily.   Yes [provider]  levothyroxine (LEVOXYL) 112 MCG tablet Take 112 mcg by mouth daily.    Yes [provider]  memantine (NAMENDA) 10 MG tablet Take 10 mg by mouth daily.    Yes [provider]  montelukast (SINGULAIR) 10 MG tablet Take 10 mg by mouth daily.    Yes [provider]  Multiple Vitamin (MULTIVITAMIN) tablet Take by mouth.   Yes [provider]  polyethylene glycol (MIRALAX / GLYCOLAX) packet Take 17 g by mouth daily.     Yes [provider]  vitamin B-12 (CYANOCOBALAMIN) 500 MCG tablet Take 500 mcg by mouth daily.    Yes [provider]  losartan (COZAAR) 25 MG tablet Take 1 tablet (25 mg total) by mouth daily. 03/31/18   Joseph ArtVann, Jessica U, DO  Multiple Vitamin (MULTIVITAMIN) tablet Take 1 tablet by mouth daily.     [provider]    Family History Family History  Problem Relation Age of Onset  . Diabetes Sister     Social History Social History   Tobacco Use  . Smoking status: Never Smoker  . Smokeless tobacco: Never Used  Substance Use Topics  . Alcohol use: No    . Drug use: No     Allergies   Patient has no known allergies.   Review of Systems Review of Systems  Skin: Positive for wound.  All other systems reviewed and are negative.    Physical Exam Updated Vital Signs BP (!) 165/118 (BP Location: Left Arm)   Pulse 77   Resp 20   SpO2 100%   Physical Exam  Constitutional:  Demented, baseline   HENT:  L scalp hematoma with 2 cm laceration that is not actively bleeding   Eyes: Pupils are equal, round, and reactive to light. EOM are normal.  Neck: Normal range of motion. Neck supple.  Cardiovascular: Normal rate and regular rhythm.  Pulmonary/Chest: Effort normal and breath sounds normal.  Abdominal: Soft. Bowel sounds are normal.  Musculoskeletal: Normal range of motion.  Neurological: She is alert.  Demented, nonverbal. Moving all extremities   Skin: Skin is warm.  Psychiatric:  Unable   Nursing note and vitals reviewed.    ED Treatments / Results  Labs (all labs ordered are listed, but only abnormal results are displayed) Labs Reviewed - No data to display  EKG None  Radiology Ct Head Wo Contrast  Result Date: 05/28/2018 CLINICAL DATA:  Fall, left skull laceration, dementia EXAM: CT HEAD WITHOUT CONTRAST TECHNIQUE: Contiguous axial images were obtained from the base of the skull through the vertex without intravenous contrast. COMPARISON:  03/29/2018 FINDINGS: Severely motion degraded images. Brain: No evidence of acute infarction, hemorrhage, extra-axial collection or mass lesion/mass effect. Global cortical and central atrophy.  Stable ventriculomegaly. Subcortical white matter and periventricular small vessel ischemic changes. Left basal ganglia lacunar infarct, chronic. Vascular: No hyperdense vessel or unexpected calcification. Skull: Normal. Negative for fracture or focal lesion. Sinuses/Orbits: Partial opacification of the right frontal sinus. Mastoid air cells are essentially clear. Other: None. IMPRESSION:  Severely motion degraded images. No evidence of acute intracranial abnormality. Atrophy with small vessel ischemic changes. Electronically Signed   By: Charline BillsSriyesh  Krishnan M.D.   On: 05/28/2018 21:13    Procedures Procedures (including critical care time)  LACERATION REPAIR Performed by: Richardean Canalavid H Uriel Horkey Authorized by: Richardean Canalavid H Raymone Pembroke Consent: Verbal consent obtained. Risks and benefits: risks, benefits and alternatives were discussed Consent given by: patient Patient identity confirmed: provided demographic data Prepped and Draped in normal sterile fashion Wound explored  Laceration Location: L scalp  Laceration Length: 2 cm  No Foreign Bodies seen or palpated  Anesthesia: none    Irrigation method: syringe Amount of cleaning: standard  Skin closure: staples  Number of staples: 3   Patient tolerance: Patient tolerated the procedure well with no immediate complications.  Medications Ordered in ED Medications - No data to display   Initial Impression / Assessment and Plan / ED Course  I have reviewed the triage vital signs and the nursing notes.  Pertinent labs & imaging results that were available during my care of the patient were reviewed by me and considered in my medical decision making (see chart for details).     Alayla Dethlefs is a 77 y.o. female here with scalp laceration after fall. Demented at baseline. Tdap up to date. Will get CT head, likely staple laceration. Bleeding controlled currently, not on blood thinners.   9:40 PM CT unremarkable. Staples placed. Bleeding controlled. Stable for discharge.   Final Clinical Impressions(s) / ED Diagnoses   Final diagnoses:  None    ED Discharge Orders    None       Charlynne Pander, MD 05/28/18 2141

## 2018-05-28 NOTE — Discharge Instructions (Signed)
Take tylenol, motrin for pain.   Staple removal in a week with your doctor or urgent care   See your doctor  Return to ER if she has severe headaches, uncontrolled bleeding, purulent discharge from the wound

## 2018-05-28 NOTE — ED Triage Notes (Signed)
Pt's husband reports pt fell in hallway around 1230. Pt remained alert. Tonight before bed he states he noted blood all over. Pt has laceration to top of her head. She is alert in triage

## 2018-05-28 NOTE — ED Notes (Addendum)
EDP into room, prior to RN assessment, see MD notes, orders received and initiated.   Pt sitting comfortably in w/c with advanced dementia, here with family from home s/p fall in the b/r. Hit L side of head. Deep abrasion/ superficial lac to L parietal head, no active bleeding. Family denies blood thinners. Takes daily ASA. Pt alert, NAD, calm, non-interactive, resps e/u, non-verbal, no dyspnea noted, skin W&D, initial VSS, BP elevated, (family denies: pt's c/o pain, sob, NV, recent illness, fever, or cough). [facility]

## 2018-10-09 ENCOUNTER — Inpatient Hospital Stay (HOSPITAL_COMMUNITY): Payer: Medicare HMO

## 2018-10-09 ENCOUNTER — Emergency Department (HOSPITAL_COMMUNITY): Payer: Medicare HMO

## 2018-10-09 ENCOUNTER — Encounter (HOSPITAL_COMMUNITY): Payer: Self-pay | Admitting: *Deleted

## 2018-10-09 ENCOUNTER — Inpatient Hospital Stay (HOSPITAL_COMMUNITY): Payer: Medicare HMO | Admitting: Certified Registered Nurse Anesthetist

## 2018-10-09 ENCOUNTER — Inpatient Hospital Stay (HOSPITAL_COMMUNITY)
Admission: EM | Admit: 2018-10-09 | Discharge: 2018-10-14 | DRG: 482 | Disposition: A | Discharge status: Skilled Nursing Facility | Payer: Medicare HMO | Attending: Internal Medicine | Admitting: Internal Medicine

## 2018-10-09 ENCOUNTER — Other Ambulatory Visit: Payer: Self-pay

## 2018-10-09 ENCOUNTER — Encounter (HOSPITAL_COMMUNITY)
Admission: EM | Disposition: A | Discharge status: Skilled Nursing Facility | Payer: Self-pay | Source: Home / Self Care | Attending: Internal Medicine

## 2018-10-09 DIAGNOSIS — I10 Essential (primary) hypertension: Secondary | ICD-10-CM | POA: Diagnosis present

## 2018-10-09 DIAGNOSIS — Z419 Encounter for procedure for purposes other than remedying health state, unspecified: Secondary | ICD-10-CM

## 2018-10-09 DIAGNOSIS — E890 Postprocedural hypothyroidism: Secondary | ICD-10-CM | POA: Diagnosis present

## 2018-10-09 DIAGNOSIS — M1990 Unspecified osteoarthritis, unspecified site: Secondary | ICD-10-CM | POA: Diagnosis present

## 2018-10-09 DIAGNOSIS — E785 Hyperlipidemia, unspecified: Secondary | ICD-10-CM | POA: Diagnosis present

## 2018-10-09 DIAGNOSIS — R296 Repeated falls: Secondary | ICD-10-CM | POA: Diagnosis present

## 2018-10-09 DIAGNOSIS — S7290XA Unspecified fracture of unspecified femur, initial encounter for closed fracture: Secondary | ICD-10-CM | POA: Diagnosis present

## 2018-10-09 DIAGNOSIS — Z7982 Long term (current) use of aspirin: Secondary | ICD-10-CM | POA: Diagnosis not present

## 2018-10-09 DIAGNOSIS — M810 Age-related osteoporosis without current pathological fracture: Secondary | ICD-10-CM | POA: Diagnosis present

## 2018-10-09 DIAGNOSIS — Z79899 Other long term (current) drug therapy: Secondary | ICD-10-CM | POA: Diagnosis not present

## 2018-10-09 DIAGNOSIS — Z833 Family history of diabetes mellitus: Secondary | ICD-10-CM

## 2018-10-09 DIAGNOSIS — E039 Hypothyroidism, unspecified: Secondary | ICD-10-CM | POA: Diagnosis not present

## 2018-10-09 DIAGNOSIS — Z820 Family history of epilepsy and other diseases of the nervous system: Secondary | ICD-10-CM | POA: Diagnosis not present

## 2018-10-09 DIAGNOSIS — D649 Anemia, unspecified: Secondary | ICD-10-CM

## 2018-10-09 DIAGNOSIS — G4733 Obstructive sleep apnea (adult) (pediatric): Secondary | ICD-10-CM | POA: Diagnosis present

## 2018-10-09 DIAGNOSIS — S72001A Fracture of unspecified part of neck of right femur, initial encounter for closed fracture: Secondary | ICD-10-CM | POA: Diagnosis present

## 2018-10-09 DIAGNOSIS — F0390 Unspecified dementia without behavioral disturbance: Secondary | ICD-10-CM | POA: Diagnosis present

## 2018-10-09 DIAGNOSIS — M5136 Other intervertebral disc degeneration, lumbar region: Secondary | ICD-10-CM | POA: Diagnosis present

## 2018-10-09 DIAGNOSIS — Z79890 Hormone replacement therapy (postmenopausal): Secondary | ICD-10-CM | POA: Diagnosis not present

## 2018-10-09 DIAGNOSIS — J45909 Unspecified asthma, uncomplicated: Secondary | ICD-10-CM | POA: Diagnosis present

## 2018-10-09 DIAGNOSIS — D72829 Elevated white blood cell count, unspecified: Secondary | ICD-10-CM | POA: Diagnosis present

## 2018-10-09 DIAGNOSIS — T148XXA Other injury of unspecified body region, initial encounter: Secondary | ICD-10-CM

## 2018-10-09 DIAGNOSIS — W1830XA Fall on same level, unspecified, initial encounter: Secondary | ICD-10-CM | POA: Diagnosis present

## 2018-10-09 DIAGNOSIS — Z9889 Other specified postprocedural states: Secondary | ICD-10-CM | POA: Diagnosis not present

## 2018-10-09 DIAGNOSIS — D62 Acute posthemorrhagic anemia: Secondary | ICD-10-CM | POA: Diagnosis not present

## 2018-10-09 DIAGNOSIS — S72011A Unspecified intracapsular fracture of right femur, initial encounter for closed fracture: Principal | ICD-10-CM | POA: Diagnosis present

## 2018-10-09 DIAGNOSIS — W19XXXA Unspecified fall, initial encounter: Secondary | ICD-10-CM | POA: Diagnosis not present

## 2018-10-09 HISTORY — PX: HIP PINNING,CANNULATED: SHX1758

## 2018-10-09 LAB — CBC WITH DIFFERENTIAL/PLATELET
Abs Immature Granulocytes: 0.06 10*3/uL (ref 0.00–0.07)
Basophils Absolute: 0 10*3/uL (ref 0.0–0.1)
Basophils Relative: 0 %
Eosinophils Absolute: 0.1 10*3/uL (ref 0.0–0.5)
Eosinophils Relative: 1 %
HCT: 46 % (ref 36.0–46.0)
Hemoglobin: 14.1 g/dL (ref 12.0–15.0)
IMMATURE GRANULOCYTES: 1 %
Lymphocytes Relative: 11 %
Lymphs Abs: 1.1 10*3/uL (ref 0.7–4.0)
MCH: 28.8 pg (ref 26.0–34.0)
MCHC: 30.7 g/dL (ref 30.0–36.0)
MCV: 94.1 fL (ref 80.0–100.0)
Monocytes Absolute: 0.3 10*3/uL (ref 0.1–1.0)
Monocytes Relative: 3 %
NRBC: 0 % (ref 0.0–0.2)
Neutro Abs: 8.9 10*3/uL — ABNORMAL HIGH (ref 1.7–7.7)
Neutrophils Relative %: 84 %
Platelets: 180 10*3/uL (ref 150–400)
RBC: 4.89 MIL/uL (ref 3.87–5.11)
RDW: 12.1 % (ref 11.5–15.5)
WBC: 10.5 10*3/uL (ref 4.0–10.5)

## 2018-10-09 LAB — I-STAT CHEM 8, ED
BUN: 14 mg/dL (ref 8–23)
Calcium, Ion: 1.23 mmol/L (ref 1.15–1.40)
Chloride: 107 mmol/L (ref 98–111)
Creatinine, Ser: 0.8 mg/dL (ref 0.44–1.00)
Glucose, Bld: 78 mg/dL (ref 70–99)
HCT: 45 % (ref 36.0–46.0)
Hemoglobin: 15.3 g/dL — ABNORMAL HIGH (ref 12.0–15.0)
Potassium: 5 mmol/L (ref 3.5–5.1)
SODIUM: 142 mmol/L (ref 135–145)
TCO2: 28 mmol/L (ref 22–32)

## 2018-10-09 LAB — PROTIME-INR
INR: 1.03
Prothrombin Time: 13.4 seconds (ref 11.4–15.2)

## 2018-10-09 SURGERY — FIXATION, FEMUR, NECK, PERCUTANEOUS, USING SCREW
Anesthesia: General | Site: Hip | Laterality: Right

## 2018-10-09 MED ORDER — ACETAMINOPHEN 650 MG RE SUPP: 650.0000 mg | Freq: Four times a day (QID) | RECTAL | Status: DC | PRN

## 2018-10-09 MED ORDER — ATORVASTATIN CALCIUM 10 MG PO TABS
10.0000 mg | ORAL_TABLET | Freq: Every day | ORAL | Status: DC
  Administered 2018-10-10 – 2018-10-14 (×5): 10 mg via ORAL
  Filled 2018-10-09 (×6): qty 1

## 2018-10-09 MED ORDER — ONDANSETRON HCL 4 MG PO TABS: 4.0000 mg | ORAL_TABLET | Freq: Four times a day (QID) | ORAL | Status: DC | PRN

## 2018-10-09 MED ORDER — 0.9 % SODIUM CHLORIDE (POUR BTL) OPTIME
TOPICAL | Status: DC | PRN
  Administered 2018-10-09: 1000 mL

## 2018-10-09 MED ORDER — ONDANSETRON HCL 4 MG/2ML IJ SOLN
INTRAMUSCULAR | Status: DC | PRN
  Administered 2018-10-09: 4 mg via INTRAVENOUS

## 2018-10-09 MED ORDER — HYDROCODONE-ACETAMINOPHEN 5-325 MG PO TABS: 1.0000 | ORAL_TABLET | ORAL | Status: DC | PRN

## 2018-10-09 MED ORDER — FLEET ENEMA 7-19 GM/118ML RE ENEM: 1.0000 | ENEMA | Freq: Once | RECTAL | Status: DC | PRN

## 2018-10-09 MED ORDER — POLYETHYLENE GLYCOL 3350 17 G PO PACK
17.0000 g | PACK | Freq: Every day | ORAL | Status: DC | PRN
  Administered 2018-10-10: 17 g via ORAL
  Filled 2018-10-09: qty 1

## 2018-10-09 MED ORDER — BUPIVACAINE HCL (PF) 0.25 % IJ SOLN
INTRAMUSCULAR | Status: AC
  Filled 2018-10-09: qty 30

## 2018-10-09 MED ORDER — PROPOFOL 10 MG/ML IV BOLUS
INTRAVENOUS | Status: AC
  Filled 2018-10-09: qty 20

## 2018-10-09 MED ORDER — DEXAMETHASONE SODIUM PHOSPHATE 10 MG/ML IJ SOLN
INTRAMUSCULAR | Status: DC | PRN
  Administered 2018-10-09: 10 mg via INTRAVENOUS

## 2018-10-09 MED ORDER — METOCLOPRAMIDE HCL 5 MG PO TABS: 5.0000 mg | ORAL_TABLET | Freq: Three times a day (TID) | ORAL | Status: DC | PRN

## 2018-10-09 MED ORDER — DOCUSATE SODIUM 100 MG PO CAPS
100.0000 mg | ORAL_CAPSULE | Freq: Two times a day (BID) | ORAL | Status: DC
  Administered 2018-10-09 – 2018-10-14 (×10): 100 mg via ORAL
  Filled 2018-10-09 (×10): qty 1

## 2018-10-09 MED ORDER — MEMANTINE HCL 10 MG PO TABS
10.0000 mg | ORAL_TABLET | Freq: Two times a day (BID) | ORAL | Status: DC
  Administered 2018-10-09 – 2018-10-14 (×10): 10 mg via ORAL
  Filled 2018-10-09 (×11): qty 1

## 2018-10-09 MED ORDER — ASPIRIN EC 325 MG PO TBEC
325.0000 mg | DELAYED_RELEASE_TABLET | Freq: Every day | ORAL | Status: DC
  Administered 2018-10-10 – 2018-10-13 (×4): 325 mg via ORAL
  Filled 2018-10-09 (×4): qty 1

## 2018-10-09 MED ORDER — ONDANSETRON HCL 4 MG/2ML IJ SOLN: 4.0000 mg | Freq: Four times a day (QID) | INTRAMUSCULAR | Status: DC | PRN

## 2018-10-09 MED ORDER — FENTANYL CITRATE (PF) 250 MCG/5ML IJ SOLN
INTRAMUSCULAR | Status: DC | PRN
  Administered 2018-10-09 (×2): 50 ug via INTRAVENOUS

## 2018-10-09 MED ORDER — METOCLOPRAMIDE HCL 5 MG/ML IJ SOLN: 5.0000 mg | Freq: Three times a day (TID) | INTRAMUSCULAR | Status: DC | PRN

## 2018-10-09 MED ORDER — PROPOFOL 10 MG/ML IV BOLUS
INTRAVENOUS | Status: DC | PRN
  Administered 2018-10-09: 80 mg via INTRAVENOUS

## 2018-10-09 MED ORDER — PROMETHAZINE HCL 25 MG PO TABS: 12.5000 mg | ORAL_TABLET | Freq: Four times a day (QID) | ORAL | Status: DC | PRN

## 2018-10-09 MED ORDER — LIDOCAINE 2% (20 MG/ML) 5 ML SYRINGE
INTRAMUSCULAR | Status: DC | PRN
  Administered 2018-10-09: 80 mg via INTRAVENOUS

## 2018-10-09 MED ORDER — CEFAZOLIN SODIUM-DEXTROSE 2-4 GM/100ML-% IV SOLN
INTRAVENOUS | Status: AC
  Filled 2018-10-09: qty 100

## 2018-10-09 MED ORDER — LACTATED RINGERS IV SOLN
INTRAVENOUS | Status: DC | PRN
  Administered 2018-10-09: 14:00:00 via INTRAVENOUS

## 2018-10-09 MED ORDER — FENTANYL CITRATE (PF) 250 MCG/5ML IJ SOLN
INTRAMUSCULAR | Status: AC
  Filled 2018-10-09: qty 5

## 2018-10-09 MED ORDER — HEPARIN SODIUM (PORCINE) 5000 UNIT/ML IJ SOLN
5000.0000 [IU] | Freq: Three times a day (TID) | INTRAMUSCULAR | Status: DC
  Administered 2018-10-09 – 2018-10-13 (×11): 5000 [IU] via SUBCUTANEOUS
  Filled 2018-10-09 (×12): qty 1

## 2018-10-09 MED ORDER — CEFAZOLIN SODIUM-DEXTROSE 2-4 GM/100ML-% IV SOLN
2.0000 g | Freq: Four times a day (QID) | INTRAVENOUS | Status: AC
  Administered 2018-10-09 – 2018-10-10 (×2): 2 g via INTRAVENOUS
  Filled 2018-10-09 (×2): qty 100

## 2018-10-09 MED ORDER — BISACODYL 10 MG RE SUPP: 10.0000 mg | Freq: Every day | RECTAL | Status: DC | PRN

## 2018-10-09 MED ORDER — ROCURONIUM BROMIDE 10 MG/ML (PF) SYRINGE
PREFILLED_SYRINGE | INTRAVENOUS | Status: DC | PRN
  Administered 2018-10-09: 50 mg via INTRAVENOUS

## 2018-10-09 MED ORDER — SENNOSIDES-DOCUSATE SODIUM 8.6-50 MG PO TABS: 1.0000 | ORAL_TABLET | Freq: Every evening | ORAL | Status: DC | PRN

## 2018-10-09 MED ORDER — ACETAMINOPHEN 325 MG PO TABS: 650.0000 mg | ORAL_TABLET | Freq: Four times a day (QID) | ORAL | Status: DC | PRN

## 2018-10-09 MED ORDER — LEVOTHYROXINE SODIUM 112 MCG PO TABS
112.0000 ug | ORAL_TABLET | Freq: Every day | ORAL | Status: DC
  Administered 2018-10-10 – 2018-10-14 (×5): 112 ug via ORAL
  Filled 2018-10-09 (×5): qty 1

## 2018-10-09 MED ORDER — DONEPEZIL HCL 10 MG PO TABS
10.0000 mg | ORAL_TABLET | Freq: Every day | ORAL | Status: DC
  Administered 2018-10-09 – 2018-10-13 (×5): 10 mg via ORAL
  Filled 2018-10-09 (×5): qty 1

## 2018-10-09 MED ORDER — CEFAZOLIN SODIUM-DEXTROSE 2-3 GM-%(50ML) IV SOLR
INTRAVENOUS | Status: DC | PRN
  Administered 2018-10-09 (×2): 2 g via INTRAVENOUS

## 2018-10-09 MED ORDER — ACETAMINOPHEN 500 MG PO TABS
500.0000 mg | ORAL_TABLET | Freq: Four times a day (QID) | ORAL | Status: AC
  Administered 2018-10-10 (×3): 500 mg via ORAL
  Filled 2018-10-09 (×4): qty 1

## 2018-10-09 MED ORDER — SUGAMMADEX SODIUM 200 MG/2ML IV SOLN
INTRAVENOUS | Status: DC | PRN
  Administered 2018-10-09: 150 mg via INTRAVENOUS

## 2018-10-09 SURGICAL SUPPLY — 34 items
BIT DRILL 4.9 CANNULATED (BIT) ×1
BIT DRILL CANN QC 4.9 LRG (BIT) ×1 IMPLANT
BNDG COHESIVE 4X5 TAN STRL (GAUZE/BANDAGES/DRESSINGS) ×3 IMPLANT
BNDG GAUZE ELAST 4 BULKY (GAUZE/BANDAGES/DRESSINGS) ×3 IMPLANT
COVER PERINEAL POST (MISCELLANEOUS) ×3 IMPLANT
COVER SURGICAL LIGHT HANDLE (MISCELLANEOUS) ×3 IMPLANT
COVER WAND RF STERILE (DRAPES) ×3 IMPLANT
DRAPE STERI IOBAN 125X83 (DRAPES) ×3 IMPLANT
DRILL BIT CANNULATED 4.9 (BIT) ×2
DRSG MEPILEX BORDER 4X4 (GAUZE/BANDAGES/DRESSINGS) ×3 IMPLANT
DURAPREP 26ML APPLICATOR (WOUND CARE) ×3 IMPLANT
ELECT REM PT RETURN 9FT ADLT (ELECTROSURGICAL) ×3
ELECTRODE REM PT RTRN 9FT ADLT (ELECTROSURGICAL) ×1 IMPLANT
GLOVE BIO SURGEON STRL SZ7.5 (GLOVE) ×6 IMPLANT
GLOVE BIOGEL PI IND STRL 8 (GLOVE) ×2 IMPLANT
GLOVE BIOGEL PI INDICATOR 8 (GLOVE) ×4
GOWN STRL REUS W/ TWL LRG LVL3 (GOWN DISPOSABLE) ×3 IMPLANT
GOWN STRL REUS W/TWL LRG LVL3 (GOWN DISPOSABLE) ×6
GUIDEWIRE ASNIS 3.2 NONCAL (WIRE) ×9 IMPLANT
KIT BASIN OR (CUSTOM PROCEDURE TRAY) ×3 IMPLANT
KIT TURNOVER KIT B (KITS) ×3 IMPLANT
MANIFOLD NEPTUNE II (INSTRUMENTS) ×3 IMPLANT
NS IRRIG 1000ML POUR BTL (IV SOLUTION) ×3 IMPLANT
PACK GENERAL/GYN (CUSTOM PROCEDURE TRAY) ×3 IMPLANT
PAD ARMBOARD 7.5X6 YLW CONV (MISCELLANEOUS) ×6 IMPLANT
SCREW ASNIS 95MM (Screw) ×3 IMPLANT
SCREW CANN 6.5X80 STRL (Screw) ×3 IMPLANT
SCREW CANN ASNIS STRL 6.5X75 (Screw) ×3 IMPLANT
SUT MON AB 2-0 CT1 36 (SUTURE) ×3 IMPLANT
SUT VIC AB 0 CT1 27 (SUTURE) ×2
SUT VIC AB 0 CT1 27XBRD ANBCTR (SUTURE) ×1 IMPLANT
TOWEL OR 17X24 6PK STRL BLUE (TOWEL DISPOSABLE) ×3 IMPLANT
TOWEL OR 17X26 10 PK STRL BLUE (TOWEL DISPOSABLE) ×3 IMPLANT
WATER STERILE IRR 1000ML POUR (IV SOLUTION) ×3 IMPLANT

## 2018-10-09 NOTE — H&P (Addendum)
Date: 10/09/2018               Patient Name:  Kelsey Norris MRN: [id number]  DOB: 1941/07/17 Age / Sex: 77 y.o., female   PCP: Tracey HarriesBouska, David, MD         Medical Service: Internal Medicine Teaching Service         Attending Physician: Dr. Eber HongMiller, Brian, MD    First Contact: Dr. Judeth CornfieldLee, Emberli Ballester, MD Pager: [phone]  Second Contact: Dr. Lorenso Courierhundi, Vahini, MD Pager: [phone]       After Hours (After 5p/  First Contact Pager: [phone]  weekends / holidays): Second Contact Pager: [phone]   Chief Complaint: Fall  History of Present Illness:   Kelsey Norris is a 77 yo F w/ PMH of hypothyoridism, osa, asthma, hld, and dementia who presented to ED after a fall. She was examined and evaluated at ED bedside with husband and son present. She was observed laying comfortably in bed with her eyes closed. She has severe dementia at baseline and was unable to answer questions but responds to her name and intermittently awakened and smiled during physical exam. Per her husband, while he was getting her ready for the day, he turned around to grab her hair products and heard a thump. He turned around and found her on the floor. He is unsure if she hit her head on way down, but she appeared more somnolent and lethargic compared to baseline after so he brought her to the ED for evaluation. His son mentions that she appeared to have been endorsing shuffling gait for the last couple months with recurrent falls but he states they usually work up her brain and head which have been negative and this is the first time her lower extremities have been imaged.   In the ED, she was found to have displaced subcapital fracture of her right femoral neck on X-ray. Ortho was consulted and IM was consulted for preoperative assessment.  Meds:  Current Meds  Medication Sig  . acetaminophen (TYLENOL) 500 MG tablet Take 500 mg by mouth every 8 (eight) hours as needed for mild pain.  . Ascorbic Acid (VITAMIN C) 1000 MG tablet Take 1,000  mg by mouth daily.    Marland Kitchen. aspirin 81 MG tablet Take 81 mg by mouth daily.   Marland Kitchen. atorvastatin (LIPITOR) 10 MG tablet Take 10 mg by mouth daily.  . budesonide-formoterol (SYMBICORT) 160-4.5 MCG/ACT inhaler Inhale 2 puffs into the lungs 2 (two) times daily.  . calcium carbonate (OSCAL) 1500 (600 Ca) MG TABS tablet Take 600 mg of elemental calcium by mouth daily with breakfast.  . cetirizine (ZYRTEC) 10 MG tablet Take 10 mg by mouth daily.  Marland Kitchen. donepezil (ARICEPT) 10 MG tablet Take 10 mg by mouth at bedtime.   . folic acid (FOLVITE) 1 MG tablet Take 1 mg by mouth daily.  Marland Kitchen. levothyroxine (LEVOXYL) 112 MCG tablet Take 112 mcg by mouth daily.   . memantine (NAMENDA) 10 MG tablet Take 10 mg by mouth 2 (two) times daily.   . montelukast (SINGULAIR) 10 MG tablet Take 10 mg by mouth daily.   . Multiple Vitamin (MULTIVITAMIN) tablet Take 1 tablet by mouth daily.   . polyethylene glycol (MIRALAX / GLYCOLAX) packet Take 17 g by mouth daily.    . vitamin B-12 (CYANOCOBALAMIN) 500 MCG tablet Take 500 mcg by mouth daily.    Allergies: Allergies as of 10/09/2018  . (No Known Allergies)   Past Medical History:  Diagnosis Date  .  Arthritis   . Asthma   . Carpal tunnel syndrome   . Dementia (HCC)   . Elevated LFTs   . Helicobacter pylori gastritis   . Hiatal hernia   . Hyperlipidemia   . Hypothyroidism   . IBS (irritable bowel syndrome)   . Sleep apnea     Family History:  Strong family hx of dementia  Family History  Problem Relation Age of Onset  . Diabetes Sister    Social History:  Ambulates with assistance at baseline. Spends most of the day sitting in her chair. Responds to stimulation but minimally alert. Denies any tobacco, EtOH, illicit substance use per family.  Social History   Tobacco Use  . Smoking status: Never Smoker  . Smokeless tobacco: Never Used  Substance Use Topics  . Alcohol use: No  . Drug use: No   Review of Systems: A complete ROS was negative except as per HPI.    Physical Exam: Blood pressure (!) 168/75, pulse 75, temperature 97.6 F (36.4 C), temperature source Oral, resp. rate (!) 22, SpO2 97 %. Physical Exam  Constitutional: She is well-developed, well-nourished, and in no distress. No distress.  Lying comfortably with eyes closed  HENT:  Head: Normocephalic and atraumatic.  Mouth/Throat: Oropharynx is clear and moist. No oropharyngeal exudate.  Eyes: Pupils are equal, round, and reactive to light. Conjunctivae and EOM are normal. No scleral icterus.  Neck: Normal range of motion. Neck supple.  Cardiovascular: Normal rate, regular rhythm, normal heart sounds and intact distal pulses.  No murmur heard. Pulmonary/Chest: Effort normal and breath sounds normal. She has no wheezes. She has no rales.  Abdominal: Soft. Bowel sounds are normal. She exhibits no distension. There is no abdominal tenderness.  Musculoskeletal:     Comments: Unable to follow directions for testing range of motion. Right knee with knee brace on  Neurological:  GCS 10, eye open to command, non-comprehensible sounds, localizes pain  Skin: Skin is warm and dry. She is not diaphoretic.  Superficial ulcers on forehead (from rubbing against the bed rails)   EKG: personally reviewed my interpretation is sinus rhythm, normal axis, flattened T waves, no ST elevation or depression.  CXR: personally reviewed my interpretation is poor inspiration, normal skeletal structures, tortuous aorta?, no lobar consolidation, no pleural effusion, no pulmonary edema  Assessment & Plan by Problem: Active Problems:   * No active hospital problems. *  Kelsey Norris is a 77 yo F w/ PMH of dementia, hypothyroidism, osa, and asthma presenting after fall with R hip fracture. Unclear how acute the fracture is as she has had frequent falls in the past with change in her gait >7 days ago per son. She has gait disturbances at baseline due to significant dementia and now may be endorsing loss of balance due  to poor conditioning. Family mentions also she has had syncopal episodes in the past with prior work up at [facility] ruling out stroke or seizures. Last admission for syncope thought to be due to dehydration from poor oral intake. Unclear if she had episode of syncope during this fall but will admit to inpatient with ortho consult for R hip fracture.  Mrs.Irizarry per ACS NSQIP surgical risk assessment has 4.4% risk of serious complications within 30 days. Unable to quantify tolerable METs due to dementia. Able to walk with assistance but sounds to be poorly conditioned at baseline due to immobility. However no history of cardiac disease, COPD, or diabetes.  R hip fracture 2/2 mechanical fall vs syncopal episode  Unclear origin for fall 2/2 dementia. No seizure-like activity witnessed. X-ray show R hip fracture. Ortho on board for OR today. On calcium calbonate 1500mg  daily at home - Appreciate ortho recs - PT/OT eval after surgery - Npo for now - Will reach out to PCP (with [facility]) about most recent DEXA scan  Altered mental status 2/2 dementia Declining mentation since 2007. On donepezil and memantine at home.  - C/w home meds: donepezil 10mg  qhs, memantine 10mg  BID  Hx of asthma No PFTs found. No wheezing on exam. On montelukast at home - C/w montelukast 10mg  daily - Duonebs PRN for wheeze  DVT prophx: subqhep Bowel: Senokot Diet: NPO til speech and swallow Code: Full  Dispo: Admit patient to Inpatient with expected length of stay greater than 2 midnights.  Signed: Judeth Cornfield, MD 10/09/2018, 11:54 AM  Pager: [phone]

## 2018-10-09 NOTE — Op Note (Signed)
10/09/2018  2:49 PM  PATIENT:  Kelsey SpragueBeverly Cregan    PRE-OPERATIVE DIAGNOSIS:  hip fracture, right  POST-OPERATIVE DIAGNOSIS:  Same  PROCEDURE:  CANNULATED HIP PINNING  SURGEON:  Sheral Apleyimothy D , MD  ASSISTANT: none  ANESTHESIA:   General  PREOPERATIVE INDICATIONS:  [name] is a  77 y.o. female who fell and was found to have a diagnosis of hip fracture, right who elected for surgical management.    The risks benefits and alternatives were discussed with the patient preoperatively including but not limited to the risks of infection, bleeding, nerve injury, cardiopulmonary complications, blood clots, malunion, nonunion, avascular necrosis, the need for revision surgery, the potential for conversion to hemiarthroplasty, among others, and the patient was willing to proceed.  OPERATIVE IMPLANTS: 6.5 mm cannulated screws x3  OPERATIVE FINDINGS: Clinical osteoporosis with weak bone, proximal femur  OPERATIVE PROCEDURE: The patient was brought to the operating room and placed in supine position. IV antibiotics were given. General anesthesia administered. Foley was also given. The patient was placed on the fracture table. The operative extremity was positioned, without any significant reduction maneuver and was prepped and draped in usual sterile fashion.  Time out was performed.  Small incisions were made distal to the greater trochanter, and 3 guidewires were introduced Into an inverted triangle configuration. The lengths were measured. The reduction was slightly valgus, and near-anatomic. I opened the cortex with a cannulated drill, and then placed the screws into position. Satisfactory fixation was achieved. I sequentially tightened the screws by hand.  I performed a live fluoroscopic exam and no screw penetrance was noted. All threads crossed the fracture site.   The wounds were irrigated copiously, and repaired with Vicryl with Steri-Strips and sterile gauze. There no complications and  the patient tolerated the procedure well.  The patient will be weightbearing as tolerated, VTE prophylaxis will be: mobilization and ASA

## 2018-10-09 NOTE — ED Triage Notes (Signed)
Patient presents to ED via [facility] states  Husband was helping patient to get dressed and turned for a minute heard  A noise and patient had fallen hitting a side table , ems states the top of the table was broken. Patient has 2 small abrasion to her forehead, patient has advanced  Dementia . Per ems husband stated patient was at her baseline

## 2018-10-09 NOTE — Anesthesia Preprocedure Evaluation (Addendum)
Anesthesia Evaluation  Patient identified by MRN, date of birth, ID band Patient unresponsive  General Assessment Comment:Dementia, non verbal  Reviewed: Allergy & Precautions, NPO status , Patient's Chart, lab work & pertinent test results  Airway Mallampati: II  TM Distance: >3 FB Neck ROM: Full    Dental  (+) Dental Advisory Given, Edentulous Upper, Edentulous Lower   Pulmonary asthma , sleep apnea ,    Pulmonary exam normal breath sounds clear to auscultation       Cardiovascular hypertension, Pt. on medications Normal cardiovascular exam Rhythm:Regular Rate:Normal  Echo 03/30/18: LVEF 65-70%, moderate LVH, severe basal septal hypertrophy without LVOT obstruction, grade 1 DD, elevated LV filling pressure, mild MR, normal LA size, mild TR, RVSP 33 mmHg, normal IVC.   Neuro/Psych PSYCHIATRIC DISORDERS Dementia Dementia     GI/Hepatic negative GI ROS, Neg liver ROS,   Endo/Other  Hypothyroidism   Renal/GU negative Renal ROS     Musculoskeletal  (+) Arthritis , Right hip fracture    Abdominal   Peds  Hematology negative hematology ROS (+)   Anesthesia Other Findings Day of surgery medications reviewed with the patient.  Reproductive/Obstetrics                            Anesthesia Physical Anesthesia Plan  ASA: III  Anesthesia Plan: General   Post-op Pain Management:    Induction: Intravenous  PONV Risk Score and Plan: 3 and Dexamethasone, Ondansetron and Treatment may vary due to age or medical condition  Airway Management Planned: Oral ETT  Additional Equipment:   Intra-op Plan:   Post-operative Plan: Extubation in OR  Informed Consent: I have reviewed the patients History and Physical, chart, labs and discussed the procedure including the risks, benefits and alternatives for the proposed anesthesia with the patient or authorized representative who has indicated his/her  understanding and acceptance.   Dental advisory given  Plan Discussed with: CRNA  Anesthesia Plan Comments:         Anesthesia Quick Evaluation

## 2018-10-09 NOTE — Consult Note (Signed)
ORTHOPAEDIC CONSULTATION  REQUESTING PHYSICIAN: Lucious Groves, DO  Chief Complaint: R hip fracture  HPI: I reviewed below history with her family and agree:  Kelsey Norris is a 77 yo F w/ PMH of hypothyoridism, osa, asthma, hld, and dementia who presented to ED after a fall. She was examined and evaluated at ED bedside with husband and son present. She was observed laying comfortably in bed with her eyes closed. She has severe dementia at baseline and was unable to answer questions but responds to her name and intermittently awakened and smiled during physical exam. Per her husband, while he was getting her ready for the day, he turned around to grab her hair products and heard a thump. He turned around and found her on the floor. He is unsure if she hit her head on way down, but she appeared more somnolent and lethargic compared to baseline after so he brought her to the ED for evaluation. His son mentions that she appeared to have been endorsing shuffling gait for the last couple months with recurrent falls but he states they usually work up her brain and head which have been negative and this is the first time her lower extremities have been imaged.   In the ED, she was found to have displaced subcapital fracture of her right femoral neck on X-ray. Ortho was consulted and IM was consulted for preoperative assessment.  Past Medical History:  Diagnosis Date  . Arthritis   . Asthma   . Carpal tunnel syndrome   . Dementia ([place])   . Elevated LFTs   . Helicobacter pylori gastritis   . Hiatal hernia   . Hyperlipidemia   . Hypothyroidism   . IBS (irritable bowel syndrome)   . Sleep apnea    Past Surgical History:  Procedure Laterality Date  . ABDOMINAL HYSTERECTOMY    . BREAST LUMPECTOMY     left  . CARPAL TUNNEL RELEASE  1994   bi-lateral  . ear lymphectomy    . 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 THYROID SURGERY    . TONSILLECTOMY    . WRIST GANGLION EXCISION     left    Social History   Socioeconomic History  . Marital status: Married    Spouse name: Not on file  . Number of children: 2  . Years of education: Not on file  . Highest education level: Not on file  Occupational History  . Occupation: retired  Scientific laboratory technician  . Financial resource strain: Not on file  . Food insecurity:    Worry: Not on file    Inability: Not on file  . Transportation needs:    Medical: Not on file    Non-medical: Not on file  Tobacco Use  . Smoking status: Never Smoker  . Smokeless tobacco: Never Used  Substance and Sexual Activity  . Alcohol use: No  . Drug use: No  . Sexual activity: Not on file  Lifestyle  . Physical activity:    Days per week: Not on file    Minutes per session: Not on file  . Stress: Not on file  Relationships  . Social connections:    Talks on phone: Not on file    Gets together: Not on file    Attends religious service: Not on file    Active member of club or organization: Not on file    Attends meetings of clubs or organizations: Not on file    Relationship  status: Not on file  Other Topics Concern  . Not on file  Social History Narrative  . Not on file   Family History  Problem Relation Age of Onset  . Diabetes Sister    No Known Allergies Prior to Admission medications   Medication Sig Start Date End Date Taking? Authorizing Provider  acetaminophen (TYLENOL) 500 MG tablet Take 500 mg by mouth every 8 (eight) hours as needed for mild pain.   Yes [provider]  Ascorbic Acid (VITAMIN C) 1000 MG tablet Take 1,000 mg by mouth daily.     Yes [provider]  aspirin 81 MG tablet Take 81 mg by mouth daily.    Yes [provider]  atorvastatin (LIPITOR) 10 MG tablet Take 10 mg by mouth daily.   Yes [provider]  budesonide-formoterol (SYMBICORT) 160-4.5 MCG/ACT inhaler Inhale 2 puffs into the lungs 2 (two) times daily.   Yes [provider]  calcium carbonate (OSCAL) 1500 (600  Ca) MG TABS tablet Take 600 mg of elemental calcium by mouth daily with breakfast.   Yes [provider]  cetirizine (ZYRTEC) 10 MG tablet Take 10 mg by mouth daily.   Yes [provider]  donepezil (ARICEPT) 10 MG tablet Take 10 mg by mouth at bedtime.    Yes [provider]  folic acid (FOLVITE) 1 MG tablet Take 1 mg by mouth daily.   Yes [provider]  levothyroxine (LEVOXYL) 112 MCG tablet Take 112 mcg by mouth daily.    Yes [provider]  memantine (NAMENDA) 10 MG tablet Take 10 mg by mouth 2 (two) times daily.    Yes [provider]  montelukast (SINGULAIR) 10 MG tablet Take 10 mg by mouth daily.    Yes [provider]  Multiple Vitamin (MULTIVITAMIN) tablet Take 1 tablet by mouth daily.    Yes [provider]  polyethylene glycol (MIRALAX / GLYCOLAX) packet Take 17 g by mouth daily.     Yes [provider]  vitamin B-12 (CYANOCOBALAMIN) 500 MCG tablet Take 500 mcg by mouth daily.    Yes [provider]  losartan (COZAAR) 25 MG tablet Take 1 tablet (25 mg total) by mouth daily. Patient not taking: Reported on 10/09/2018 03/31/18   Geradine Girt, DO   Dg Chest Port 1 View  Result Date: 10/09/2018 CLINICAL DATA:  Preoperative study.  Right hip fracture. EXAM: PORTABLE CHEST 1 VIEW COMPARISON:  March 29, 2018 FINDINGS: The heart size and mediastinal contours are within normal limits. Both lungs are clear. The visualized skeletal structures are unremarkable. IMPRESSION: No active disease. Electronically Signed   By: Dorise Bullion III M.D   On: 10/09/2018 10:25   Dg Hips Bilat W Or Wo Pelvis 3-4 Views  Result Date: 10/09/2018 CLINICAL DATA:  Trauma, fell striking table EXAM: DG HIP (WITH OR WITHOUT PELVIS) 3-4V BILAT COMPARISON:  None FINDINGS: Osseous mineralization. Hip joint spaces preserved. Minimally displaced subcapital fracture of the RIGHT femoral neck. No dislocation. Mild degenerative changes  at pubic symphysis and SI joints. No additional fractures identified. Degenerative disc and facet disease changes at visualized lower lumbar spine. IMPRESSION: Displaced subcapital fracture RIGHT femoral neck. Electronically Signed   By: Lavonia Dana M.D.   On: 10/09/2018 09:38    Positive ROS: All other systems have been reviewed and were otherwise negative with the exception of those mentioned in the HPI and as above.  Labs cbc Recent Labs    10/09/18 1105  WBC 10.5  HGB 14.1  15.3*  HCT 46.0  45.0  PLT 180    Labs inflam No results for input(s): CRP in the last 72 hours.  Invalid input(s): ESR  Labs coag Recent Labs    10/09/18 1105  INR 1.03    Recent Labs    10/09/18 1105  NA 142  K 5.0  CL 107  GLUCOSE 78  BUN 14  CREATININE 0.80    Physical Exam: Vitals:   10/09/18 1245 10/09/18 1248  BP:  (!) 133/91  Pulse: 75 72  Resp: (!) 22 (!) 22  Temp:    SpO2: 100% 100%   General: Alert, no acute distress Cardiovascular: No pedal edema Respiratory: No cyanosis, no use of accessory musculature GI: No organomegaly, abdomen is soft and non-tender Skin: No lesions in the area of chief complaint other than those listed below in MSK exam.  Neurologic: Sensation intact distally save for the below mentioned MSK exam Psychiatric: Patient is competent for consent with normal mood and affect Lymphatic: No axillary or cervical lymphadenopathy  MUSCULOSKELETAL:  RLE: pain with ROM, WWP foot Other extremities are atraumatic with painless ROM and NVI.  Assessment: R fem neck fracture  Plan: I discussed the risks and benefits of CRPP vs. Hemiarthroplasty with her family. They wish to proceed with CRPP of the hip.    Renette Butters, MD Cell [phone]   10/09/2018 2:11 PM

## 2018-10-09 NOTE — Anesthesia Postprocedure Evaluation (Signed)
Anesthesia Post Note  Patient: Kelsey Norris  Procedure(s) Performed: CANNULATED HIP PINNING (Right Hip)     Patient location during evaluation: PACU Anesthesia Type: General Level of consciousness: awake and alert, awake and oriented Pain management: pain level controlled Vital Signs Assessment: post-procedure vital signs reviewed and stable Respiratory status: spontaneous breathing, nonlabored ventilation and respiratory function stable Cardiovascular status: blood pressure returned to baseline and stable Postop Assessment: no apparent nausea or vomiting Anesthetic complications: no    Last Vitals:  Vitals:   10/09/18 1551 10/09/18 1621  BP: (!) 160/81 (!) 172/77  Pulse: 65 68  Resp: 17 18  Temp: (!) 36.2 C 36.7 C  SpO2: 100% 99%    Last Pain:  Vitals:   10/09/18 1621  TempSrc: Oral                 Cecile HearingStephen Edward Tynan Boesel

## 2018-10-09 NOTE — Progress Notes (Signed)
1511 - POC edit sheet done for [facility] - glucometer does not recognize MRN - [facility] 110

## 2018-10-09 NOTE — Anesthesia Procedure Notes (Signed)
Procedure Name: Intubation Date/Time: 10/09/2018 2:21 PM Performed by: Clearnce Sorrel, CRNA Pre-anesthesia Checklist: Patient identified, Emergency Drugs available, Suction available, Patient being monitored and Timeout performed Patient Re-evaluated:Patient Re-evaluated prior to induction Oxygen Delivery Method: Circle system utilized Preoxygenation: Pre-oxygenation with 100% oxygen Induction Type: IV induction Ventilation: Mask ventilation without difficulty Laryngoscope Size: Mac and 3 Grade View: Grade I Tube type: Oral Tube size: 7.0 mm Number of attempts: 1 Airway Equipment and Method: Stylet Placement Confirmation: ETT inserted through vocal cords under direct vision,  positive ETCO2 and breath sounds checked- equal and bilateral Secured at: 21 cm Tube secured with: Tape Dental Injury: Teeth and Oropharynx as per pre-operative assessment

## 2018-10-09 NOTE — ED Notes (Signed)
  Attempted to walk patient was a 2 assist gait is very slow.

## 2018-10-09 NOTE — Transfer of Care (Signed)
Immediate Anesthesia Transfer of Care Note  Patient: Kelsey Norris  Procedure(s) Performed: CANNULATED HIP PINNING (Right Hip)  Patient Location: PACU  Anesthesia Type:General  Level of Consciousness: awake  Airway & Oxygen Therapy: Patient Spontanous Breathing and Patient connected to nasal cannula oxygen  Post-op Assessment: Report given to RN and Post -op Vital signs reviewed and stable  Post vital signs: Reviewed and stable  Last Vitals:  Vitals Value Taken Time  BP 105/89 10/09/2018  3:06 PM  Temp    Pulse 72 10/09/2018  3:08 PM  Resp 15 10/09/2018  3:08 PM  SpO2 100 % 10/09/2018  3:08 PM  Vitals shown include unvalidated device data.  Last Pain:  Vitals:   10/09/18 0812  TempSrc: Oral         Complications: No apparent anesthesia complications

## 2018-10-09 NOTE — ED Provider Notes (Signed)
[facility] EMERGENCY DEPARTMENT Provider Note   CSN: [id number] Arrival date & time: 10/09/18  4098     History   Chief Complaint No chief complaint on file.   HPI Kelsey Norris is a 77 y.o. female.  HPI  77 year old female, history of dementia which is reportedly "advanced dementia".  Presents to the hospital after having a fall.  Evidently the husband was helping her to get dressed, when he turned his back to her for just a minute he heard her fall and found her on the ground.  There was a couple of abrasions over her forehead which was thought to be related more to her BiPAP mask though it is unclear if she actually struck her head as the side table did seem to be disrupted.  The patient does not have any specific complaints and when you ask her questions she really does not answer you.  She seems happy and interactive at her baseline.  Level 5 caveat applies secondary to dementia  Past Medical History:  Diagnosis Date  . Arthritis   . Asthma   . Carpal tunnel syndrome   . Dementia   . Elevated LFTs   . Helicobacter pylori gastritis   . Hiatal hernia   . Hyperlipidemia   . Hypothyroidism   . IBS (irritable bowel syndrome)   . Sleep apnea     Patient Active Problem List   Diagnosis Date Noted  . Syncope 03/29/2018  . Helicobacter pylori gastritis 02/25/2011  . WEIGHT LOSS 12/31/2010  . ABDOMINAL PAIN, UPPER 12/31/2010  . DEMENTIA 08/14/2009  . ASTHMA 08/14/2009  . CONSTIPATION 08/14/2009    Past Surgical History:  Procedure Laterality Date  . ABDOMINAL HYSTERECTOMY    . BREAST LUMPECTOMY     left  . CARPAL TUNNEL RELEASE  1994   bi-lateral  . ear lymphectomy    . 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 THYROID SURGERY    . TONSILLECTOMY    . WRIST GANGLION EXCISION     left     OB History   No obstetric history on file.      Home Medications    Prior to Admission medications   Medication Sig Start Date End Date Taking?  Authorizing Provider  acetaminophen (TYLENOL) 500 MG tablet Take 500 mg by mouth every 8 (eight) hours as needed for mild pain.    [provider]  Ascorbic Acid (VITAMIN C) 1000 MG tablet Take 1,000 mg by mouth daily.      [provider]  aspirin 81 MG tablet Take 81 mg by mouth daily.     [provider]  atorvastatin (LIPITOR) 10 MG tablet Take 10 mg by mouth daily.    [provider]  budesonide-formoterol (SYMBICORT) 160-4.5 MCG/ACT inhaler Inhale 2 puffs into the lungs 2 (two) times daily.    [provider]  calcium carbonate (OSCAL) 1500 (600 Ca) MG TABS tablet Take 600 mg of elemental calcium by mouth daily with breakfast.    [provider]  cetirizine (ZYRTEC) 10 MG tablet Take 10 mg by mouth daily.    [provider]  donepezil (ARICEPT) 10 MG tablet Take 10 mg by mouth at bedtime.     [provider]  folic acid (FOLVITE) 1 MG tablet Take 1 mg by mouth daily.    [provider]  levothyroxine (LEVOXYL) 112 MCG tablet Take 112 mcg by mouth daily.     [provider]  losartan (COZAAR) 25 MG tablet Take 1 tablet (25 mg total) by mouth daily. 03/31/18   Joseph Art, DO  memantine (NAMENDA) 10 MG tablet Take 10 mg by mouth daily.     [provider]  montelukast (SINGULAIR) 10 MG tablet Take 10 mg by mouth daily.     [provider]  Multiple Vitamin (MULTIVITAMIN) tablet Take 1 tablet by mouth daily.     [provider]  Multiple Vitamin (MULTIVITAMIN) tablet Take by mouth.    [provider]  polyethylene glycol (MIRALAX / GLYCOLAX) packet Take 17 g by mouth daily.      [provider]  vitamin B-12 (CYANOCOBALAMIN) 500 MCG tablet Take 500 mcg by mouth daily.     [provider]    Family History Family History  Problem Relation Age of Onset  . Diabetes Sister     Social History Social History   Tobacco Use  . Smoking status: Never  Smoker  . Smokeless tobacco: Never Used  Substance Use Topics  . Alcohol use: No  . Drug use: No     Allergies   Patient has no known allergies.   Review of Systems Review of Systems  Unable to perform ROS: Dementia     Physical Exam Updated Vital Signs There were no vitals taken for this visit.  Physical Exam Vitals signs and nursing note reviewed.  Constitutional:      General: She is not in acute distress.    Appearance: She is well-developed.  HENT:     Head: Normocephalic.     Comments: 2 small abrasions to the forehead just above the medial eyebrows bilaterally, no hematomas, no malocclusion, no raccoon eyes or battle sign    Mouth/Throat:     Pharynx: No oropharyngeal exudate.  Eyes:     General: No scleral icterus.       Right eye: No discharge.        Left eye: No discharge.     Conjunctiva/sclera: Conjunctivae normal.     Pupils: Pupils are equal, round, and reactive to light.  Neck:     Musculoskeletal: Normal range of motion and neck supple.     Thyroid: No thyromegaly.     Vascular: No JVD.  Cardiovascular:     Rate and Rhythm: Normal rate and regular rhythm.     Heart sounds: Normal heart sounds. No murmur. No friction rub. No gallop.   Pulmonary:     Effort: Pulmonary effort is normal. No respiratory distress.     Breath sounds: Normal breath sounds. No wheezing or rales.  Abdominal:     General: Bowel sounds are normal. There is no distension.     Palpations: Abdomen is soft. There is no mass.     Tenderness: There is no abdominal tenderness.  Musculoskeletal: Normal range of motion.        General: No tenderness.     Comments: Bilateral arms appear to have normal range of motion, bilateral hips with normal range of motion to both external rotation and flexion, no leg length discrepancies, no signs of contusions hematomas or deformity  Lymphadenopathy:     Cervical: No cervical adenopathy.  Skin:    General: Skin is warm and dry.      Findings: No erythema or rash.     Comments: Abrasions as noted to the bilateral forehead, these measure 3 to 4 mm in diameter  Neurological:     Mental Status: She is alert.  Coordination: Coordination normal.     Comments: The patient is happy, somewhat interactive, she does not answer questions, she does not follow all commands but seems to be moving all 4 extremities at baseline  Psychiatric:        Behavior: Behavior normal.      ED Treatments / Results  Labs (all labs ordered are listed, but only abnormal results are displayed) Labs Reviewed - No data to display  EKG EKG Interpretation  Date/Time:  Sunday October 09 2018 07:59:57 EST Ventricular Rate:  60 PR Interval:    QRS Duration: 97 QT Interval:  481 QTC Calculation: 481 R Axis:   79 Text Interpretation:  Normal sinus rhythm poor baseline Abnormal ekg Confirmed by Eber HongMiller, Takari Lundahl ([id number]) on 10/09/2018 8:11:22 AM   Radiology No results found.  Procedures Procedures (including critical care time)  Medications Ordered in ED Medications - No data to display   Initial Impression / Assessment and Plan / ED Course  I have reviewed the triage vital signs and the nursing notes.  Pertinent labs & imaging results that were available during my care of the patient were reviewed by me and considered in my medical decision making (see chart for details).  Clinical Course as of Oct 09 1358  Sun Oct 09, 2018  [id number] I personally looked at the x-ray, there does appear to be a subcapital fracture of the right proximal femur, this is been confirmed by the radiology interpretation.  The patient was reexamined, family in the room, she is now holding that leg in slight external rotation.  We will get preop labs, EKG, chest x-ray and the patient will be admitted to the hospital.  Will discuss with orthopedics.   [BM]  1041 Case discussed with Dr. Renaye Rakersim Murphy of orthopedics who will see the patient in consultation and hopefully be  able to operate today   [BM]    Clinical Course User Index [BM] Eber HongMiller, Jonai Weyland, MD   Given the patient's cognitive deficit I suspect she is at baseline, will await husband's arrival, I see no indication to do a CT scan of the patient's brain or cervical spine at this time.  She will need to ambulate to show that she can walk at her baseline, I do not see any signs of pelvic fracture but given her lack of ability to describe her injury and pain we will need to see her functionally move.  She is not on anticoagulants.  D/w Internal Medicine residents who will admit  Final Clinical Impressions(s) / ED Diagnoses   Final diagnoses:  Closed fracture of right hip, initial encounter [facility](HCC)      Eber HongMiller, Aston Lieske, MD 10/09/18 1400

## 2018-10-10 DIAGNOSIS — G4733 Obstructive sleep apnea (adult) (pediatric): Secondary | ICD-10-CM

## 2018-10-10 DIAGNOSIS — E890 Postprocedural hypothyroidism: Secondary | ICD-10-CM

## 2018-10-10 DIAGNOSIS — S72001A Fracture of unspecified part of neck of right femur, initial encounter for closed fracture: Secondary | ICD-10-CM

## 2018-10-10 DIAGNOSIS — E785 Hyperlipidemia, unspecified: Secondary | ICD-10-CM

## 2018-10-10 DIAGNOSIS — Z79890 Hormone replacement therapy (postmenopausal): Secondary | ICD-10-CM

## 2018-10-10 DIAGNOSIS — W19XXXA Unspecified fall, initial encounter: Secondary | ICD-10-CM

## 2018-10-10 DIAGNOSIS — Z79899 Other long term (current) drug therapy: Secondary | ICD-10-CM

## 2018-10-10 DIAGNOSIS — F0390 Unspecified dementia without behavioral disturbance: Secondary | ICD-10-CM

## 2018-10-10 DIAGNOSIS — J45909 Unspecified asthma, uncomplicated: Secondary | ICD-10-CM

## 2018-10-10 LAB — CBC
HCT: 38.8 % (ref 36.0–46.0)
Hemoglobin: 12.6 g/dL (ref 12.0–15.0)
MCH: 29.6 pg (ref 26.0–34.0)
MCHC: 32.5 g/dL (ref 30.0–36.0)
MCV: 91.1 fL (ref 80.0–100.0)
Platelets: 166 10*3/uL (ref 150–400)
RBC: 4.26 MIL/uL (ref 3.87–5.11)
RDW: 12.1 % (ref 11.5–15.5)
WBC: 9 10*3/uL (ref 4.0–10.5)
nRBC: 0 % (ref 0.0–0.2)

## 2018-10-10 LAB — COMPREHENSIVE METABOLIC PANEL
ALT: 22 U/L (ref 0–44)
AST: 30 U/L (ref 15–41)
Albumin: 3.7 g/dL (ref 3.5–5.0)
Alkaline Phosphatase: 66 U/L (ref 38–126)
Anion gap: 14 (ref 5–15)
BUN: 13 mg/dL (ref 8–23)
CO2: 23 mmol/L (ref 22–32)
Calcium: 9.6 mg/dL (ref 8.9–10.3)
Chloride: 100 mmol/L (ref 98–111)
Creatinine, Ser: 1.06 mg/dL — ABNORMAL HIGH (ref 0.44–1.00)
GFR calc non Af Amer: 50 mL/min — ABNORMAL LOW (ref >60)
GFR, EST AFRICAN AMERICAN: 58 mL/min — AB (ref >60)
Glucose, Bld: 186 mg/dL — ABNORMAL HIGH (ref 70–99)
Potassium: 4 mmol/L (ref 3.5–5.1)
Sodium: 137 mmol/L (ref 135–145)
Total Bilirubin: 1.2 mg/dL (ref 0.3–1.2)
Total Protein: 6.6 g/dL (ref 6.5–8.1)

## 2018-10-10 LAB — GLUCOSE, CAPILLARY: Glucose-Capillary: 110 mg/dL — ABNORMAL HIGH (ref 70–99)

## 2018-10-10 MED ORDER — MOMETASONE FURO-FORMOTEROL FUM 200-5 MCG/ACT IN AERO
2.0000 | INHALATION_SPRAY | Freq: Two times a day (BID) | RESPIRATORY_TRACT | Status: DC
  Filled 2018-10-10: qty 8.8

## 2018-10-10 MED ORDER — FOLIC ACID 1 MG PO TABS
1.0000 mg | ORAL_TABLET | Freq: Every day | ORAL | Status: DC
  Administered 2018-10-10 – 2018-10-14 (×5): 1 mg via ORAL
  Filled 2018-10-10 (×5): qty 1

## 2018-10-10 MED ORDER — LOSARTAN POTASSIUM 50 MG PO TABS: 25.0000 mg | ORAL_TABLET | Freq: Every day | ORAL | Status: DC

## 2018-10-10 MED ORDER — CALCIUM CARBONATE 1250 (500 CA) MG PO TABS: 1.0000 | ORAL_TABLET | Freq: Every day | ORAL | Status: DC

## 2018-10-10 MED ORDER — CALCIUM CARBONATE 1250 (500 CA) MG PO TABS
1.0000 | ORAL_TABLET | Freq: Every day | ORAL | Status: DC
  Administered 2018-10-10 – 2018-10-14 (×5): 500 mg via ORAL
  Filled 2018-10-10 (×5): qty 1

## 2018-10-10 MED ORDER — CYANOCOBALAMIN 500 MCG PO TABS
500.0000 ug | ORAL_TABLET | Freq: Every day | ORAL | Status: DC
  Administered 2018-10-10 – 2018-10-14 (×5): 500 ug via ORAL
  Filled 2018-10-10 (×5): qty 1

## 2018-10-10 MED ORDER — CALCIUM CARBONATE 1500 (600 CA) MG PO TABS
600.0000 mg | ORAL_TABLET | Freq: Every day | ORAL | Status: DC
  Filled 2018-10-10: qty 1

## 2018-10-10 NOTE — Progress Notes (Signed)
    Subjective: Patient not interactive.  Advanced dementia at baseline.  Per family, as tolerated movement in bed without apparent discomfort.  Tolerating diet.  Objective:   VITALS:   Vitals:   10/09/18 1621 10/09/18 2034 10/10/18 0048 10/10/18 0522  BP: (!) 172/77 136/82 (!) 150/75 (!) 148/98  Pulse: 68 68 61 (!) 58  Resp: 18 18 18 18   Temp: 98.1 F (36.7 C) 98.1 F (36.7 C) 98.3 F (36.8 C) 98.8 F (37.1 C)  TempSrc: Oral Oral Oral Oral  SpO2: 99% 100% 100% 100%  Weight:      Height:       CBC Latest Ref Rng & Units 10/10/2018 10/09/2018 10/09/2018  WBC 4.0 - 10.5 K/uL 9.0 10.5 -  Hemoglobin 12.0 - 15.0 g/dL 75.612.6 43.314.1 15.3(H)  Hematocrit 36.0 - 46.0 % 38.8 46.0 45.0  Platelets 150 - 400 K/uL 166 180 -   BMP Latest Ref Rng & Units 10/10/2018 10/09/2018 03/30/2018  Glucose 70 - 99 mg/dL 295(J186(H) 78 81  BUN 8 - 23 mg/dL 13 14 14   Creatinine 0.44 - 1.00 mg/dL 8.84(Z1.06(H) 6.600.80 6.300.93  Sodium 135 - 145 mmol/L 137 142 143  Potassium 3.5 - 5.1 mmol/L 4.0 5.0 3.7  Chloride 98 - 111 mmol/L 100 107 115(H)  CO2 22 - 32 mmol/L 23 - 22  Calcium 8.9 - 10.3 mg/dL 9.6 - 9.3   Intake/Output      12/15 0701 - 12/16 0700   P.O. 0   I.V. (mL/kg) 150 (2.7)   Other 150   IV Piggyback 200   Total Intake(mL/kg) 500 (9.1)   Urine (mL/kg/hr) 200   Blood 25   Total Output 225   Net +275       Urine Occurrence 2 x      Physical Exam: General: NAD.  Supine in bed.  Not interactive w/ exam.   Family at bedside. Resp: No increased wob Cardio: regular rate and rhythm ABD soft Neurologically intact MSK RLE: Feet warm Dorsiflexion/Plantar flexion intact Incision: dressing C/D/I.  Mild bloody drainage    Assessment: 1 Day Post-Op  S/P Procedure(s) (LRB): CANNULATED HIP PINNING (Right) by Dr. Jewel Baizeimothy D. Murphy on 10/09/18  Principal Problem:   Fracture of femoral neck, right, closed (HCC) Active Problems:   Femoral fracture (HCC)   Right femoral neck fracture, s/p cannulated hip  pinning. Stable from an orthopedic perspective postop day 1 Tolerating diet Pain appears to be well controlled Not yet mobilized.    Plan: Mobilize with therapy Incentive Spirometry Elevate and Apply ice  Weightbearing: WBAT RLE Insicional and dressing care: Dressings left intact until follow-up Showering: Keep dressing dry VTE prophylaxis: Aspirin, SCDs, mobilize Pain control: Minimize narcotics.  Tylenol for mild discomfort. Follow - up plan: 2 weeks in the office with Dr. Wandra Feinstein. Murphy. Contact information:  Margarita Ranaimothy Murphy MD, Aquilla HackerHenry Martensen PA-C  Dispo: TBD.  Therapy evaluations pending.  Discharge when ready medically. Advanced dementia at baseline.  Needed significant assistance / gait belt prior to the injury.  Has wheelchair and other DME.     Kelsey BilletHenry Calvin Martensen III, PA-C 10/10/2018, 6:53 AM

## 2018-10-10 NOTE — Progress Notes (Signed)
   Subjective:  Kelsey Norris is a 77 y.o. with PMH of levothyroxine, dementia, and asthma admit for R hip fracture on hospital day 1  Kelsey Norris was examined and evaluated at bedside this AM with husband. She was observed eating apple sauce with assistance. She is alert but unable to answer questions and her husband at bedside states this is at baseline for her due to her severe dementia. She does not wince or grimace during physical exam and shows no obvious sign of pain. According the husband, denies any respiratory distress, cough, nausea, vomiting.  Objective:  Vital signs in last 24 hours: Vitals:   10/09/18 1621 10/09/18 2034 10/10/18 0048 10/10/18 0522  BP: (!) 172/77 136/82 (!) 150/75 (!) 148/98  Pulse: 68 68 61 (!) 58  Resp: 18 18 18 18   Temp: 98.1 F (36.7 C) 98.1 F (36.7 C) 98.3 F (36.8 C) 98.8 F (37.1 C)  TempSrc: Oral Oral Oral Oral  SpO2: 99% 100% 100% 100%  Weight:      Height:       Physical Exam  Constitutional: She is well-developed, well-nourished, and in no distress. No distress.  Appear comfortable  HENT:  Head: Normocephalic and atraumatic.  Mouth/Throat: Oropharynx is clear and moist. No oropharyngeal exudate.  Eyes: Pupils are equal, round, and reactive to light. Conjunctivae and EOM are normal. No scleral icterus.  Neck: Normal range of motion. Neck supple.  Surgical scar on neck  Cardiovascular: Normal rate, regular rhythm, normal heart sounds and intact distal pulses.  No murmur heard. Pulmonary/Chest: Effort normal and breath sounds normal. She has no rales.  Abdominal: Soft. Bowel sounds are normal. There is no abdominal tenderness.  Musculoskeletal: Normal range of motion.        General: No edema.  Neurological: She is alert.  Unable to answer questions or follow directions but tracks with eye movement appropriately  Skin: Skin is warm and dry. She is not diaphoretic.  Surgical site on R hip covered by bandaging appear clean without significant  exudates   Assessment/Plan:  Principal Problem:   Fracture of femoral neck, right, closed (HCC) Active Problems:   Femoral fracture (HCC)  Kelsey Norris is a 77 yo F w/ PMh of dementia, hypothyroidism, and asthma admit for hip fracture. She underwent orthopedic surgery yesterday without complications and appears comfortable on examination today. Difficult to assess mobility or pain status due to dementia. PT/OT recommending SNF at discharge. She will require placement prior to discharge  R hip fracture 2/2 mechanical fall vs syncopal episode S/p cannulated hip pinning by ortho yesterday. Appear comfortable on exam - Appreciate ortho recs - PT/OT eval: SNF for discharge - Appreciate Social Work support for SNF placement - Will reach out to PCP (with [facility]) about most recent DEXA scan - Most likely will require IV bisphosphonate as difficulty staying upright / swallowing dysfunction (difficulty following instructions)  Altered mental status 2/2 dementia Declining mentation since 2007. On donepezil and memantine at home.  - C/w home meds: donepezil 10mg  qhs, memantine 10mg  BID  Hypothyroidism Hx of goiter w/ surgical thyroidectomy and radioactive iodine due to recurrence - C/w home meds: levothyroxine 112mcg daily  DVT prophx: subqhep Diet: Senokot Bowel: Soft Code: Full  Dispo: Anticipated discharge in approximately 1-2 day(s).   Theotis BarrioLee, Joshua K, MD 10/10/2018, 6:28 AM Pager: [phone]

## 2018-10-10 NOTE — Clinical Social Work Note (Signed)
Clinical Social Work Assessment  Patient Details  Name: Candida PeelingBeverly Grattan MRN: [id number] Date of Birth: 1941-08-23  Date of referral:  10/10/18               Reason for consult:  Discharge Planning                Permission sought to share information with:  Facility Medical sales representativeContact Representative, Family Supports Permission granted to share information::  No(pt disoriented; nonverbal)  Name::     Dondra SpryJames Courtright; Cala BradfordKimberly  Agency::  SNFs- [facility]  Relationship::  husband; daughter  Contact Information:  [phone]  Housing/Transportation Living arrangements for the past 2 months:  Single Family Home Source of Information:  Spouse Patient Interpreter Needed:    Criminal Activity/Legal Involvement Pertinent to Current Situation/Hospitalization:  No - Comment as needed Significant Relationships:  Adult Children, Spouse Lives with:  Self Do you feel safe going back to the place where you live?  Yes Need for family participation in patient care:  Yes (Comment)  Care giving concerns:  Pt lives at home with husband, he is her primary caregiver. She has a son and daughter nearby that also provide support. Pt husband states that he originally wanted to take her home but after seeing her work with therapies that he thinks that SNF may be better.   Social Worker assessment / plan: CSW spoke with pt husband at bedside. Pt sound asleep, has memory deficits at baseline and did not rouse during CSW visit. Introduced self, role, and reason for visit. Pt husband is primary caregiver, he assists pt with her daily care. They have children that also provide assistance. Pt went to rehab before "on the fourth floor of the cancer center at [facility] CSW attempted to elicit more information about whether or not it was an inpatient rehab program or SNF. Pt husband unable to determine this. Pt husband states that if possible he wants a referral sent there. Discussed that if it was an inpatient rehab that  pt may not be eligible for that and we would have to refer pt to SNFs in the local area. Pt husband states understanding.   CSW continuing to follow.  Employment status:  Retired Database administratornsurance information:  Managed Medicare PT Recommendations:  24 Hour Supervision, Skilled Nursing Facility Information / Referral to community resources:  Skilled Nursing Facility  Patient/Family's Response to care: Pt husband amenable to speaking with CSW, he is interested in rehab, intends on taking pt home after rehab.  Patient/Family's Understanding of and Emotional Response to Diagnosis, Current Treatment, and Prognosis:  Pt husband appears to understand diagnosis, current treatment and prognosis. He states he is the main caregiver for pt and he would like for pt to go to rehab and then return home. Pt husband emotionally appropriate and positive in affect throughout assessment.  Emotional Assessment Appearance:  Appears older than stated age Attitude/Demeanor/Rapport:  Unable to Assess Affect (typically observed):  Unable to Assess Orientation:  Oriented to Self Alcohol / Substance use:  Not Applicable Psych involvement (Current and /or in the community):  No (Comment)  Discharge Needs  Concerns to be addressed:  Care Coordination Readmission within the last 30 days:  No Current discharge risk:  Cognitively Impaired, Dependent with Mobility Barriers to Discharge:  English as a second language teachernsurance Authorization, Continued Medical Work up   Dillard'ssabel H Roselie Cirigliano, LCSWA 10/10/2018, 12:28 PM

## 2018-10-10 NOTE — Evaluation (Signed)
Physical Therapy Evaluation Patient Details Name: Kelsey Norris MRN: [id number] DOB: 05-Nov-1940 Today's Date: 10/10/2018   History of Present Illness  77 y.o. female who fell and was found to have a R hip fracture, now s/p cannulated hip pinning on 12/15. PMHx includes dementia, Arthritis, Asthma, Carpal tunnel syndrome, Hiatal hernia, and Sleep apnea.  Clinical Impression  Pt husband present during session and provides PLOF and home living information. Prior to fall, husband reports pt was ambulating in home with HHA, or mobilizes with use of w/c. Pt husband provides total assist for ADLs. Pt currently requires total Ax2 for bed mobility and max-totalAx2 for transfers. PT currently recommending SNF level rehab at d/c to improve safe mobility before going home. Pt's husband requests rehab at [facility] 4th floor rehab, where pt has had therapy in the past. PT will continue to follow acutely.      Follow Up Recommendations SNF    Equipment Recommendations  None recommended by PT       Precautions / Restrictions Precautions Precautions: Fall Required Braces or Orthoses: Other Brace Other Brace: per spouse, pt wears R knee brace at baseline (in room) Restrictions Weight Bearing Restrictions: Yes RLE Weight Bearing: Weight bearing as tolerated      Mobility  Bed Mobility Overal bed mobility: Needs Assistance Bed Mobility: Supine to Sit;Sit to Supine     Supine to sit: Total assist;+2 for physical assistance;+2 for safety/equipment Sit to supine: Total assist;+2 for physical assistance;+2 for safety/equipment   General bed mobility comments: pt not following commands to assist with movements, assist with trunk and LEs over EOB; use of bed pad to support/scoot hips; pt initially with strong posterior lean upon sitting up right requiring maxA for balance, progressed to minguard with time  Transfers Overall transfer level: Needs assistance Equipment used: 2 person hand  held assist Transfers: Sit to/from Stand Sit to Stand: Max assist;Total assist;+2 physical assistance;+2 safety/equipment         General transfer comment: pt fluctuating between max-totalA+2 for sit<>stand with use of bed pad to assist with supporting/bringing hips upright. pt intermittently with bil knee buckling requiring knee block, often bracing LEs against EOB for increased stability. When pt with more purposeful need to stand (looking out window), pt able to better maintain upright position with external assist. Performed x4 sit<>stands from EOB   Ambulation/Gait Unable                    Balance Overall balance assessment: Needs assistance Sitting-balance support: Feet supported Sitting balance-Leahy Scale: Poor Sitting balance - Comments: initially requires maxA for sitting balance with posterior lean, progressed to close minguard with increased time, though when fatigued requires increased assist Postural control: Posterior lean Standing balance support: Bilateral upper extremity supported Standing balance-Leahy Scale: Zero Standing balance comment: requires max-totalA+2                             Pertinent Vitals/Pain Pain Assessment: Faces Faces Pain Scale: Hurts little more Pain Location: R hip Pain Descriptors / Indicators: Guarding;Grimacing Pain Intervention(s): Monitored during session;Repositioned;Limited activity within patient's tolerance    Home Living Family/patient expects to be discharged to:: Private residence Living Arrangements: Spouse/significant other Available Help at Discharge: Friend(s);Available 24 hours/day           Home Equipment: Bedside commode;Wheelchair - manual;Hospital bed;Other (comment)(spouse has gaitbelt)      Prior Function Level of Independence: Needs assistance  Gait / Transfers Assistance Needed: use of HHA for mobility; pt's spouse reports he also uses w/c pending pt's participation level and level  of fatigue; spouse uses gait belt during mobility   ADL's / Homemaking Assistance Needed: spouse reports pt overall max-totalA for self-care, reports he assists with bathing/dressing at bed level            Extremity/Trunk Assessment   Upper Extremity Assessment Upper Extremity Assessment: Defer to OT evaluation    Lower Extremity Assessment Lower Extremity Assessment: RLE deficits/detail RLE Deficits / Details: ROM and strength expectedly impaired post-surgical intervention  RLE: Unable to fully assess due to pain RLE Coordination: decreased gross motor;decreased fine motor       Communication   Communication: Other (comment)(mostly mumbles, unintelligible speech)  Cognition Arousal/Alertness: Awake/alert Behavior During Therapy: Flat affect Overall Cognitive Status: History of cognitive impairments - at baseline                                 General Comments: pt with baseline dementia; alert during session but does not follow one step commands this session       General Comments General comments (skin integrity, edema, etc.): spouse present start of session to assist with providing PLOF, agreeable to post acute rehab, expresses interest in rehab facility in [facility] but unsure of the name         Assessment/Plan    PT Assessment Patient needs continued PT services  PT Problem List Decreased strength;Decreased range of motion;Decreased activity tolerance;Decreased balance;Decreased mobility;Decreased coordination;Decreased cognition;Decreased knowledge of use of DME;Decreased safety awareness;Pain       PT Treatment Interventions DME instruction;Gait training;Functional mobility training;Therapeutic activities;Therapeutic exercise;Balance training;Cognitive remediation;Patient/family education    PT Goals (Current goals can be found in the Care Plan section)  Acute Rehab PT Goals Patient Stated Goal: none stated PT Goal Formulation: With  patient/family Time For Goal Achievement: 10/24/18 Potential to Achieve Goals: Fair    Frequency Min 3X/week        Co-evaluation PT/OT/SLP Co-Evaluation/Treatment: Yes Reason for Co-Treatment: Complexity of the patient's impairments (multi-system involvement) PT goals addressed during session: Mobility/safety with mobility OT goals addressed during session: Strengthening/ROM;ADL's and self-care       AM-PAC PT "6 Clicks" Mobility  Outcome Measure Help needed turning from your back to your side while in a flat bed without using bedrails?: Total Help needed moving from lying on your back to sitting on the side of a flat bed without using bedrails?: A Lot Help needed moving to and from a bed to a chair (including a wheelchair)?: Total Help needed standing up from a chair using your arms (e.g., wheelchair or bedside chair)?: A Lot Help needed to walk in hospital room?: Total Help needed climbing 3-5 steps with a railing? : Total 6 Click Score: 8    End of Session Equipment Utilized During Treatment: Gait belt;Other (comment)(husband applied R knee brace pt utilizes at home for stabili) Activity Tolerance: Patient limited by pain;Patient limited by fatigue Patient left: in bed;with call bell/phone within reach;with bed alarm set;with family/visitor present Nurse Communication: Mobility status;Weight bearing status PT Visit Diagnosis: Unsteadiness on feet (R26.81);Other abnormalities of gait and mobility (R26.89);Muscle weakness (generalized) (M62.81);History of falling (Z91.81);Difficulty in walking, not elsewhere classified (R26.2);Pain Pain - Right/Left: Right Pain - part of body: Leg    Time: [id number] PT Time Calculation (min) (ACUTE ONLY): 38 min   Charges:   PT  Evaluation $PT Eval Moderate Complexity: 1 Mod          Anikah Hogge B. Beverely Risen PT, DPT Acute Rehabilitation Services Pager [phone] Office [phone]   Elon Alas Fleet 10/10/2018, 1:29  PM

## 2018-10-10 NOTE — Evaluation (Addendum)
Occupational Therapy Evaluation Patient Details Name: Kelsey PeelingBeverly Norris MRN: [id number] DOB: 1941-04-28 Today's Date: 10/10/2018    History of Present Illness 77 y.o. female who fell and was found to have a R hip fracture, now s/p cannulated hip pinning on 12/15. PMHx includes dementia, Arthritis, Asthma, Carpal tunnel syndrome, Hiatal hernia, and Sleep apnea.   Clinical Impression   This 77 y/o female presents with the above. Spouse present at start of session and providing PLOF. At baseline pt's spouse assists with providing HHA for mobility, reports use of w/c for further distances. P's spouse provides assist for ADLs at home. Pt with baseline dementia, overall with pleasant demeanor but does not verbalize or respond to simple commands this session. Pt currently requiring max-totalA+2 for bed mobility and for sit<>stand from EOB, with +2 HHA utilized for sit<>stand. She currently requires max-totalA for all aspects of ADLs. Pt will benefit from continued acute OT services and recommend follow up therapy services in SNF setting to progress pt's overall level of ADL and mobility performance. Will follow.     Follow Up Recommendations  SNF;Supervision/Assistance - 24 hour    Equipment Recommendations  Other (comment)(TBD in next venue)           Precautions / Restrictions Precautions Precautions: Fall Required Braces or Orthoses: Other Brace Other Brace: per spouse, pt wears R knee brace at baseline (in room) Restrictions Weight Bearing Restrictions: Yes RLE Weight Bearing: Weight bearing as tolerated      Mobility Bed Mobility Overal bed mobility: Needs Assistance Bed Mobility: Supine to Sit;Sit to Supine     Supine to sit: Total assist;+2 for physical assistance;+2 for safety/equipment Sit to supine: Total assist;+2 for physical assistance;+2 for safety/equipment   General bed mobility comments: pt not following commands to assist with movements, assist with trunk and LEs over  EOB; use of bed pad to support/scoot hips; pt initially with strong posterior lean upon sitting up right requiring maxA for balance, progressed to minguard with time  Transfers Overall transfer level: Needs assistance Equipment used: 2 person hand held assist Transfers: Sit to/from Stand Sit to Stand: Max assist;Total assist;+2 physical assistance;+2 safety/equipment         General transfer comment: pt fluctuating between max-totalA+2 for sit<>stand with use of bed pad to assist with supporting/bringing hips upright. pt intermittently with bil knee buckling requiring knee block, often bracing LEs against EOB for increased stability. When pt with more purposeful need to stand (looking out window), pt able to better maintain upright position with external assist. Performed x4 sit<>stands from EOB     Balance Overall balance assessment: Needs assistance Sitting-balance support: Feet supported Sitting balance-Leahy Scale: Poor Sitting balance - Comments: initially requires maxA for sitting balance with posterior lean, progressed to close minguard with increased time, though when fatigued requires increased assist Postural control: Posterior lean Standing balance support: Bilateral upper extremity supported Standing balance-Leahy Scale: Zero Standing balance comment: requires max-totalA+2                           ADL either performed or assessed with clinical judgement   ADL Overall ADL's : Needs assistance/impaired                                     Functional mobility during ADLs: Maximal assistance;Total assistance;+2 for safety/equipment;+2 for physical assistance General ADL Comments: pt limited due to  pain as well as increased limitations due to her baseline cognitive status; currently requires totalA for all ADL tasks     Vision         Perception     Praxis      Pertinent Vitals/Pain Pain Assessment: Faces Faces Pain Scale: Hurts little  more Pain Location: R hip Pain Descriptors / Indicators: Guarding;Grimacing Pain Intervention(s): Monitored during session;Limited activity within patient's tolerance;Repositioned     Hand Dominance     Extremity/Trunk Assessment Upper Extremity Assessment Upper Extremity Assessment: Generalized weakness   Lower Extremity Assessment Lower Extremity Assessment: Defer to PT evaluation       Communication Communication Communication: Other (comment)(mostly mumbles, unintelligible speech)   Cognition Arousal/Alertness: Awake/alert Behavior During Therapy: Flat affect Overall Cognitive Status: History of cognitive impairments - at baseline                                 General Comments: pt with baseline dementia; alert during session but does not follow one step commands this session    General Comments  spouse present start of session to assist with providing PLOF, agreeable to post acute rehab, expresses interest in rehab facility in [facility] but unsure of the name     Exercises     Shoulder Instructions      Home Living Family/patient expects to be discharged to:: Private residence Living Arrangements: Spouse/significant other Available Help at Discharge: Friend(s);Available 24 hours/day                         Home Equipment: Bedside commode;Wheelchair - manual;Hospital bed;Other (comment)(spouse has gaitbelt)          Prior Functioning/Environment Level of Independence: Needs assistance  Gait / Transfers Assistance Needed: use of HHA for mobility; pt's spouse reports he also uses w/c pending pt's participation level and level of fatigue; spouse uses gait belt during mobility  ADL's / Homemaking Assistance Needed: spouse reports pt overall max-totalA for self-care, reports he assists with bathing/dressing at bed level             OT Problem List: Decreased strength;Decreased range of motion;Decreased activity tolerance;Impaired balance  (sitting and/or standing);Decreased cognition;Decreased safety awareness;Decreased knowledge of precautions;Pain      OT Treatment/Interventions: Self-care/ADL training;Therapeutic exercise;Therapeutic activities;Patient/family education;Balance training;DME and/or AE instruction    OT Goals(Current goals can be found in the care plan section) Acute Rehab OT Goals Patient Stated Goal: none stated OT Goal Formulation: With patient Time For Goal Achievement: 10/24/18 Potential to Achieve Goals: Fair  OT Frequency: Min 2X/week   Barriers to D/C:            Co-evaluation PT/OT/SLP Co-Evaluation/Treatment: Yes Reason for Co-Treatment: Complexity of the patient's impairments (multi-system involvement);For patient/therapist safety;To address functional/ADL transfers   OT goals addressed during session: Strengthening/ROM;ADL's and self-care      AM-PAC OT "6 Clicks" Daily Activity     Outcome Measure Help from another person eating meals?: A Lot Help from another person taking care of personal grooming?: Total Help from another person toileting, which includes using toliet, bedpan, or urinal?: Total Help from another person bathing (including washing, rinsing, drying)?: Total Help from another person to put on and taking off regular upper body clothing?: Total Help from another person to put on and taking off regular lower body clothing?: Total 6 Click Score: 7   End of Session Equipment Utilized During  Treatment: Gait belt Nurse Communication: Mobility status  Activity Tolerance: Patient tolerated treatment well;Other (comment)(limited due to cognition) Patient left: in bed;with call bell/phone within reach;with bed alarm set  OT Visit Diagnosis: Other abnormalities of gait and mobility (R26.89);History of falling (Z91.81)                Time: 1610-9604 OT Time Calculation (min): 40 min Charges:  OT General Charges $OT Visit: 1 Visit OT Evaluation $OT Eval Moderate Complexity:  1 Mod  Marcy Siren, OT [employer] Pager [phone] Office [phone]   Orlando Penner 10/10/2018, 1:21 PM

## 2018-10-10 NOTE — Progress Notes (Signed)
  Date: 10/10/2018  Patient name: Kelsey Norris  Medical record number: [id number]  Date of birth: 09-Feb-1941   I have seen and evaluated Kelsey PeelingBeverly Cullom and discussed their care with the Residency Team. Briefly, Ms. Roselyn BeringCrim is a 77yo woman with PMH of hypothyroidism, OSA, HLD, and dementia who presented to the ED after a fall and was noted to have a broken hip.  The patient is not very responsive to verbal stimuli.  She is taken care of by her husband.  She underwent surgical repair yesterday evening, and her husband notes that she is back to her baseline mentally this morning.    PMHx, Fam Hx, and/or Soc Hx : No smoking, ambulates with assistance at home  Vitals:   10/10/18 0927 10/10/18 1439  BP: 135/78 103/61  Pulse: 73 (!) 57  Resp: 18 14  Temp: 97.8 F (36.6 C) 98.4 F (36.9 C)  SpO2: 100% 100%   General: Elderly woman, sitting in bed Eyes: Anicteric sclerae HENT: Supple, MMM CV: RR, NR, no murmur Pulm: CTAB, no wheezing Abd: Soft, +BS Ext: She has a bandage over right lateral hip.  Her limbs show no edema or swelling Skin: No bruising noted Neuro: She is only minimally verbally responsive, and with words/sentences that are unintelligible.  She moves her extremities spontaneously and does not appear to be in pain.   Assessment and Plan: I have seen and evaluated the patient as outlined above. I agree with the formulated Assessment and Plan as detailed in the residents' note, with the following changes:   1. Right hip fracture s/p repair, POD 1 - PT/OT pending - SW - Likely osteoporotic, will need bisphosphonate - Pain and nausea control - She will likely need SNF placement for strength  Other issues per Dr. Marigene EhlersLee's note.   [name], Emily B, MD 12/16/20193:51 PM

## 2018-10-10 NOTE — NC FL2 (Signed)
[employer] MEDICAID FL2 LEVEL OF CARE SCREENING TOOL     IDENTIFICATION  Patient Name: Kelsey PeelingBeverly Macpherson Birthdate: Aug 18, 1941 Sex: female Admission Date (Current Location): 10/09/2018  [facility] [place]   Facility and Address:  [facility], [address], [place], [place] [address]      Provider Number: [id number]  Attending Physician Name and Address:  [name], Emily B, MD  Relative Name and Phone Number:  Dorothe PeaJoseph Jeffus; husband; [phone]    Current Level of Care: Hospital Recommended Level of Care: Skilled Nursing Facility Prior Approval Number:    Date Approved/Denied:   PASRR Number: [id number]  Discharge Plan: SNF    Current Diagnoses: Patient Active Problem List   Diagnosis Date Noted  . Fracture of femoral neck, right, closed (HCC) 10/10/2018  . Femoral fracture (HCC) 10/09/2018  . Syncope 03/29/2018  . Helicobacter pylori gastritis 02/25/2011  . WEIGHT LOSS 12/31/2010  . ABDOMINAL PAIN, UPPER 12/31/2010  . DEMENTIA 08/14/2009  . ASTHMA 08/14/2009  . CONSTIPATION 08/14/2009    Orientation RESPIRATION BLADDER Height & Weight     Self  Normal External catheter, Incontinent Weight: 121 lb 4.1 oz (55 kg) Height:  5\' 2"  (157.5 cm)  BEHAVIORAL SYMPTOMS/MOOD NEUROLOGICAL BOWEL NUTRITION STATUS      Continent Diet(see discharge summary)  AMBULATORY STATUS COMMUNICATION OF NEEDS Skin   Extensive Assist Non-Verbally Surgical wounds, Skin abrasions(abrasions on face; incision on right hip with silicone dressing)                       Personal Care Assistance Level of Assistance  Bathing, Feeding, Dressing Bathing Assistance: Maximum assistance Feeding assistance: Limited assistance Dressing Assistance: Maximum assistance     Functional Limitations Info  Sight, Hearing, Speech Sight Info: Adequate Hearing Info: Adequate Speech Info: Impaired(pt is non verbal)    SPECIAL CARE FACTORS FREQUENCY  PT (By  licensed PT), OT (By licensed OT)     PT Frequency: 5x week OT Frequency: 5x week            Contractures Contractures Info: Not present    Additional Factors Info  Code Status, Allergies, Psychotropic Code Status Info: Full Code Allergies Info: No Known Allergies  Psychotropic Info: donepezil (ARICEPT) tablet 10 mg daily at bedtime PO; memantine (NAMENDA) tablet 10 mg 2x daily PO         Current Medications (10/10/2018):  This is the current hospital active medication list Current Facility-Administered Medications  Medication Dose Route Frequency Provider Last Rate Last Dose  . acetaminophen (TYLENOL) tablet 650 mg  650 mg Oral Q6H PRN Chundi, Vahini, MD       Or  . acetaminophen (TYLENOL) suppository 650 mg  650 mg Rectal Q6H PRN Chundi, Vahini, MD      . acetaminophen (TYLENOL) tablet 500 mg  500 mg Oral Q6H Sheral ApleyMurphy, Timothy D, MD   500 mg at 10/10/18 0530  . aspirin EC tablet 325 mg  325 mg Oral Q breakfast Sheral ApleyMurphy, Timothy D, MD   325 mg at 10/10/18 0851  . atorvastatin (LIPITOR) tablet 10 mg  10 mg Oral Daily Theotis BarrioLee, Joshua K, MD      . bisacodyl (DULCOLAX) suppository 10 mg  10 mg Rectal Daily PRN Sheral ApleyMurphy, Timothy D, MD      . calcium carbonate (OS-CAL - dosed in mg of elemental calcium) tablet 500 mg of elemental calcium  1 tablet Oral Q breakfast [name], Emily B, MD      .  docusate sodium (COLACE) capsule 100 mg  100 mg Oral BID Sheral Apley, MD   100 mg at 10/09/18 2137  . donepezil (ARICEPT) tablet 10 mg  10 mg Oral QHS Theotis Barrio, MD   10 mg at 10/09/18 2134  . folic acid (FOLVITE) tablet 1 mg  1 mg Oral Daily Chundi, Vahini, MD      . heparin injection 5,000 Units  5,000 Units Subcutaneous Q8H Chundi, Vahini, MD   5,000 Units at 10/10/18 0530  . HYDROcodone-acetaminophen (NORCO/VICODIN) 5-325 MG per tablet 1-2 tablet  1-2 tablet Oral Q4H PRN Sheral Apley, MD      . levothyroxine (SYNTHROID, LEVOTHROID) tablet 112 mcg  112 mcg Oral QAC breakfast Theotis Barrio,  MD   112 mcg at 10/10/18 0530  . memantine (NAMENDA) tablet 10 mg  10 mg Oral BID Theotis Barrio, MD   10 mg at 10/09/18 2134  . metoCLOPramide (REGLAN) tablet 5-10 mg  5-10 mg Oral Q8H PRN Sheral Apley, MD       Or  . metoCLOPramide (REGLAN) injection 5-10 mg  5-10 mg Intravenous Q8H PRN Sheral Apley, MD      . mometasone-formoterol (DULERA) 200-5 MCG/ACT inhaler 2 puff  2 puff Inhalation BID Chundi, Vahini, MD      . ondansetron (ZOFRAN) tablet 4 mg  4 mg Oral Q6H PRN Sheral Apley, MD       Or  . ondansetron [facility]) injection 4 mg  4 mg Intravenous Q6H PRN Sheral Apley, MD      . polyethylene glycol (MIRALAX / GLYCOLAX) packet 17 g  17 g Oral Daily PRN Sheral Apley, MD      . promethazine (PHENERGAN) tablet 12.5 mg  12.5 mg Oral Q6H PRN Chundi, Vahini, MD      . senna-docusate (Senokot-S) tablet 1 tablet  1 tablet Oral QHS PRN Chundi, Vahini, MD      . sodium phosphate (FLEET) 7-19 GM/118ML enema 1 enema  1 enema Rectal Once PRN Sheral Apley, MD      . vitamin B-12 (CYANOCOBALAMIN) tablet 500 mcg  500 mcg Oral Daily Lorenso Courier, MD         Discharge Medications: Please see discharge summary for a list of discharge medications.  Relevant Imaging Results:  Relevant Lab Results:   Additional Information [id number] [place], [place]

## 2018-10-11 ENCOUNTER — Encounter (HOSPITAL_COMMUNITY): Payer: Self-pay | Admitting: Orthopedic Surgery

## 2018-10-11 ENCOUNTER — Other Ambulatory Visit: Payer: Self-pay

## 2018-10-11 DIAGNOSIS — E039 Hypothyroidism, unspecified: Secondary | ICD-10-CM

## 2018-10-11 DIAGNOSIS — D72829 Elevated white blood cell count, unspecified: Secondary | ICD-10-CM

## 2018-10-11 LAB — URINALYSIS, ROUTINE W REFLEX MICROSCOPIC
Bilirubin Urine: NEGATIVE
Glucose, UA: NEGATIVE mg/dL
Hgb urine dipstick: NEGATIVE
Ketones, ur: NEGATIVE mg/dL
Leukocytes, UA: NEGATIVE
Nitrite: NEGATIVE
Protein, ur: NEGATIVE mg/dL
Specific Gravity, Urine: 1.003 — ABNORMAL LOW (ref 1.005–1.030)
pH: 6 (ref 5.0–8.0)

## 2018-10-11 LAB — CBC
HEMATOCRIT: 34.2 % — AB (ref 36.0–46.0)
HEMOGLOBIN: 10.7 g/dL — AB (ref 12.0–15.0)
MCH: 29 pg (ref 26.0–34.0)
MCHC: 31.3 g/dL (ref 30.0–36.0)
MCV: 92.7 fL (ref 80.0–100.0)
Platelets: 170 10*3/uL (ref 150–400)
RBC: 3.69 MIL/uL — ABNORMAL LOW (ref 3.87–5.11)
RDW: 12.3 % (ref 11.5–15.5)
WBC: 15.4 10*3/uL — ABNORMAL HIGH (ref 4.0–10.5)
nRBC: 0 % (ref 0.0–0.2)

## 2018-10-11 LAB — BASIC METABOLIC PANEL
Anion gap: 12 (ref 5–15)
BUN: 16 mg/dL (ref 8–23)
CHLORIDE: 106 mmol/L (ref 98–111)
CO2: 23 mmol/L (ref 22–32)
Calcium: 9.4 mg/dL (ref 8.9–10.3)
Creatinine, Ser: 1.08 mg/dL — ABNORMAL HIGH (ref 0.44–1.00)
GFR calc Af Amer: 57 mL/min — ABNORMAL LOW (ref >60)
GFR calc non Af Amer: 49 mL/min — ABNORMAL LOW (ref >60)
Glucose, Bld: 123 mg/dL — ABNORMAL HIGH (ref 70–99)
Potassium: 4.3 mmol/L (ref 3.5–5.1)
Sodium: 141 mmol/L (ref 135–145)

## 2018-10-11 MED ORDER — ENOXAPARIN SODIUM 40 MG/0.4ML ~~LOC~~ SOLN: 40.0000 mg | SUBCUTANEOUS | 0 refills | Status: DC

## 2018-10-11 NOTE — Progress Notes (Signed)
    Subjective: Patient not interactive.  Advanced dementia at baseline.  Per family, as tolerated movement in bed without apparent discomfort.  Tolerating diet.  Up standing at bedside yesterday.  Objective:   VITALS:   Vitals:   10/10/18 0927 10/10/18 1439 10/10/18 2007 10/11/18 0541  BP: 135/78 103/61 125/61 (!) 140/52  Pulse: 73 (!) 57 68 (!) 58  Resp: 18 14  16   Temp: 97.8 F (36.6 C) 98.4 F (36.9 C) 99.1 F (37.3 C) 98.6 F (37 C)  TempSrc: Oral Oral  Oral  SpO2: 100% 100% 100% 100%  Weight:      Height:       CBC Latest Ref Rng & Units 10/11/2018 10/10/2018 10/09/2018  WBC 4.0 - 10.5 K/uL 15.4(H) 9.0 10.5  Hemoglobin 12.0 - 15.0 g/dL 10.7(L) 12.6 14.1  Hematocrit 36.0 - 46.0 % 34.2(L) 38.8 46.0  Platelets 150 - 400 K/uL 170 166 180   BMP Latest Ref Rng & Units 10/11/2018 10/10/2018 10/09/2018  Glucose 70 - 99 mg/dL 409(W123(H) 119(J186(H) 78  BUN 8 - 23 mg/dL 16 13 14   Creatinine 0.44 - 1.00 mg/dL 4.78(G1.08(H) 9.56(O1.06(H) 1.300.80  Sodium 135 - 145 mmol/L 141 137 142  Potassium 3.5 - 5.1 mmol/L 4.3 4.0 5.0  Chloride 98 - 111 mmol/L 106 100 107  CO2 22 - 32 mmol/L 23 23 -  Calcium 8.9 - 10.3 mg/dL 9.4 9.6 -   Intake/Output      12/16 0701 - 12/17 0700   P.O. 410   Total Intake(mL/kg) 410 (7.5)   Urine (mL/kg/hr) 700 (0.5)   Total Output 700   Net -290          Physical Exam: General: NAD.  Supine in bed.  Not interactive w/ exam.   Family at bedside. MSK RLE: Feet warm Dorsiflexion/Plantar flexion intact Incision: dressing C/D/I.  Mild bloody drainage    Assessment: 2 Days Post-Op  S/P Procedure(s) (LRB): CANNULATED HIP PINNING (Right) by Dr. Jewel Baizeimothy D. Murphy on 10/09/18  Principal Problem:   Fracture of femoral neck, right, closed (HCC) Active Problems:   Dementia (HCC)   Femoral fracture (HCC)   Right femoral neck fracture, s/p cannulated hip pinning. Stable from an orthopedic perspective Tolerating diet Pain appears to be well  controlled  Plan: Mobilize with therapy Incentive Spirometry Apply ice prn  Weightbearing: WBAT RLE Insicional and dressing care: Dressings left intact until follow-up Showering: Keep dressing dry VTE prophylaxis: Aspirin, SCDs, mobilize Pain control: Minimize narcotics.  Tylenol for mild discomfort. Follow - up plan: 2 weeks in the office with Dr. Wandra Feinstein. Murphy. Contact information:  Margarita Ranaimothy Murphy MD, Aquilla HackerHenry Martensen PA-C  Dispo: Discharge when ready medically.   Advanced dementia at baseline.  Needed significant assistance / gait belt prior to the injury.  Has wheelchair and other DME.     Kelsey KernHenry Calvin Martensen III, PA-C 10/11/2018, 6:07 AM

## 2018-10-11 NOTE — Progress Notes (Signed)
   Subjective:  Kelsey Norris was seen resting in her bed this morning. She was pleasant and mumbling.   Objective:  Vital signs in last 24 hours: Vitals:   10/10/18 0927 10/10/18 1439 10/10/18 2007 10/11/18 0541  BP: 135/78 103/61 125/61 (!) 140/52  Pulse: 73 (!) 57 68 (!) 58  Resp: 18 14  16   Temp: 97.8 F (36.6 C) 98.4 F (36.9 C) 99.1 F (37.3 C) 98.6 F (37 C)  TempSrc: Oral Oral  Oral  SpO2: 100% 100% 100% 100%  Weight:      Height:       Physical Exam  Constitutional: She appears well-developed and well-nourished. No distress.  HENT:  Head: Normocephalic and atraumatic.  Eyes: Conjunctivae are normal.  Cardiovascular: Normal rate, regular rhythm and normal heart sounds.  Respiratory: Effort normal and breath sounds normal. No respiratory distress. She has no wheezes.  GI: Soft. Bowel sounds are normal. She exhibits no distension. There is no abdominal tenderness.  Musculoskeletal:        General: No edema.  Neurological: She is alert.  Skin: Rash (small rash on right buttock that was bandaged) noted. She is not diaphoretic.    Assessment/Plan: Kelsey Norris is a 77 y.o f with hypothyroidism, osa, hld, and dementia presented post a fall and was found to have a displaced right femoral neck fracture. She had cannulated hip pinning done on 10/09/18.   Displaced right femoral neck fracture s/p hip pinning, post op day 2 The patient appears to be doing well post surgery. She has not received any doses of norco.  culture.   -Orthopedics has signed off surgically -continue incentive spirometry  -DME for wheelchair  Leukocytosis She remains afebrile, but she has an increase in her wbc count today from 9 to 15.4. UA ordered but has not been collected yet. No abnormal breath sounds appreciated on exam, but patient's son mentions that the patient has had an increased cough. Will order sputum cutlure  -sputum culture -pending ua  Dementia  -Continue namenda 10mg  bid  -Continue  donepezil 10mg  qhs  Hypothyroidism  -Continue levothyroxine 112mcg daily  Dispo: Anticipated discharge 1 day pending snf placement.   Lorenso Courierhundi, Matilde Pottenger, MD 10/11/2018, 1:03 PM Pager: [phone]

## 2018-10-11 NOTE — Social Work (Addendum)
1:06pm- CSW has sent referral for auth.  11:46am- CSW spoke with [facility], they are an acute inpatient rehab facility. We discussed pt and that she had been there previously. We reviewed that the pt was recommended for SNF and Rehab director let CSW know that there wouldn't be beds available until the end of the week. Per MD pt is medically stable once placement is found and insurance approval received therefore will refer pt to local SNFs for offers.  Kelsey GravesIsabel Lillionna Norris, MSW, [facility] Center[facility] [place] Clinical Social Work [phone]

## 2018-10-11 NOTE — Plan of Care (Signed)
  Problem: Activity: Goal: Ability to avoid complications of mobility impairment will improve Outcome: Progressing Goal: Ability to tolerate increased activity will improve Outcome: Progressing   Problem: Education: Goal: Verbalization of understanding the information provided will improve Outcome: Progressing   Problem: Coping: Goal: Level of anxiety will decrease Outcome: Progressing   Problem: Physical Regulation: Goal: Postoperative complications will be avoided or minimized Outcome: Progressing   Problem: Respiratory: Goal: Ability to maintain a clear airway will improve Outcome: Progressing   

## 2018-10-11 NOTE — Progress Notes (Addendum)
  Date: 10/11/2018  Patient name: Kelsey Norris  Medical record number: [id number]  Date of birth: 1941-07-16   I have seen and evaluated this patient and I have discussed the plan of care with the house staff. Please see Dr. Cannon Kettlehundi's note for complete details. I concur with her findings.  If concerned for PNA, could also consider CXR, as patient may not be able to comply with collection for sputum culture.   We also noted a new swelling at site of bandage from hip surgery.  Possible hematoma.  We will reassess in morning, if worsening, will ask our surgery colleagues to come and reassess.  She continues to have pain at site, but this would be expected after surgery.  H/H have dropped from 14 --> 12 --> 10 this morning.  Check CBC in the AM.  Monitor for any acute changes signaling symptomatic anemia.   [name], Emily B, MD 10/11/2018, 3:48 PM

## 2018-10-11 NOTE — Discharge Summary (Signed)
Name: Kelsey Norris MRN: [id number] DOB: 1940-12-30 77 y.o. PCP: Tracey Harries, MD  Date of Admission: 10/09/2018  7:55 AM Date of Discharge: 10/14/2018 Attending Physician: Inez Catalina, MD  Discharge Diagnosis: 1. R hip fracture 2. Post-operative anemia  Discharge Medications: Allergies as of 10/14/2018   No Known Allergies     Medication List    STOP taking these medications   losartan 25 MG tablet Commonly known as:  COZAAR     TAKE these medications   acetaminophen 500 MG tablet Commonly known as:  TYLENOL Take 500 mg by mouth every 8 (eight) hours as needed for mild pain.   aspirin 81 MG tablet Take 81 mg by mouth daily.   atorvastatin 10 MG tablet Commonly known as:  LIPITOR Take 10 mg by mouth daily.   budesonide-formoterol 160-4.5 MCG/ACT inhaler Commonly known as:  SYMBICORT Inhale 2 puffs into the lungs 2 (two) times daily.   calcium carbonate 1500 (600 Ca) MG Tabs tablet Commonly known as:  OSCAL Take 600 mg of elemental calcium by mouth daily with breakfast.   cetirizine 10 MG tablet Commonly known as:  ZYRTEC Take 10 mg by mouth daily.   donepezil 10 MG tablet Commonly known as:  ARICEPT Take 10 mg by mouth at bedtime.   enoxaparin 40 MG/0.4ML injection Commonly known as:  LOVENOX Inject 0.4 mLs (40 mg total) into the skin daily for 30 doses. For 30 days post op for DVT prophylaxis   folic acid 1 MG tablet Commonly known as:  FOLVITE Take 1 mg by mouth daily.   HYDROcodone-acetaminophen 5-325 MG tablet Commonly known as:  NORCO/VICODIN Take 1 tablet by mouth every 4 (four) hours as needed for up to 7 days for moderate pain (pain score 4-6).   LEVOXYL 112 MCG tablet Generic drug:  levothyroxine Take 112 mcg by mouth daily.   memantine 10 MG tablet Commonly known as:  NAMENDA Take 10 mg by mouth 2 (two) times daily.   montelukast 10 MG tablet Commonly known as:  SINGULAIR Take 10 mg by mouth daily.   multivitamin  tablet Take 1 tablet by mouth daily.   polyethylene glycol packet Commonly known as:  MIRALAX / GLYCOLAX Take 17 g by mouth daily.   vitamin B-12 500 MCG tablet Commonly known as:  CYANOCOBALAMIN Take 500 mcg by mouth daily.   vitamin C 1000 MG tablet Take 1,000 mg by mouth daily.            Durable Medical Equipment  (From admission, onward)         Start     Ordered   10/11/18 1317  For home use only DME standard manual wheelchair with seat cushion  Once    Comments:  Patient suffers from right hip fracture  which impairs their ability to perform daily activities like bathing in the home.  A walker will not resolve  issue with performing activities of daily living. A wheelchair will allow patient to safely perform daily activities. Patient can safely propel the wheelchair in the home or has a caregiver who can provide assistance.  Accessories: elevating leg rests (ELRs), wheel locks, extensions and anti-tippers.   10/11/18 1316          Disposition and follow-up:   Kelsey Norris was discharged from [facility] in Stable condition.  At the hospital follow up visit please address:  1.  R hip fracture - Discharged after fixation surgery by ortho for hip fracture -  Please ensure she follows up with orthopedics - Check cbc to ensure her post-operative bleed and leukocytosis has resolved  2.  Labs / imaging needed at time of follow-up: cbc  3.  Pending labs/ test needing follow-up: sputum culture  Follow-up Appointments:  Contact information for follow-up providers    Sheral Apley, MD Follow up in 2 week(s).   Specialty:  Orthopedic Surgery Contact information: [address] [place] [address] [phone]        Tracey Harries, MD. Call.   Specialty:  Family Medicine Contact information: [address] [place] [address] [phone]            Contact information for after-discharge care     Destination    [facility] Preferred SNF .   Service:  Skilled Nursing Contact information: [address] [place] [address] [phone]                  Hospital Course by problem list: 1. R Hip Fracture: Ms.Esh presented after a fall and was noticed to have an acute displaced subcapital fracture of the right femoral neck. Ortho was consulted who performed right hip pinning in the OR. Post-operatively her hgb dropped down to 8 and she required 1 unit of blood transfusion. Ortho placed TED hose over surgical site to provide compression and hemoglobin stabilized. She was discharged to SNF w/ ortho f/u.   Discharge Vitals:   BP (!) 108/42 (BP Location: Left Arm)   Pulse 66   Temp 98.3 F (36.8 C) (Oral)   Resp 14   Ht 5\' 2"  (1.575 m)   Wt 58.9 kg   SpO2 100%   BMI 23.75 kg/m   Pertinent Labs, Studies, and Procedures:  CBC Latest Ref Rng & Units 10/14/2018 10/13/2018 10/13/2018  WBC 4.0 - 10.5 K/uL 10.3 - 12.2(H)  Hemoglobin 12.0 - 15.0 g/dL 1.6(W) 1.0(X) 8.3(L)  Hematocrit 36.0 - 46.0 % 30.0(L) 26.9(L) 25.8(L)  Platelets 150 - 400 K/uL 135(L) - 181   DG HIP (WITH OR WITHOUT PELVIS) 3-4V BILAT  COMPARISON:  None  FINDINGS: Osseous mineralization.  Hip joint spaces preserved.  Minimally displaced subcapital fracture of the RIGHT femoral neck.  No dislocation.  Mild degenerative changes at pubic symphysis and SI joints.  No additional fractures identified.  Degenerative disc and facet disease changes at visualized lower lumbar spine.  IMPRESSION: Displaced subcapital fracture RIGHT femoral neck.  Discharge Instructions: Kelsey Norris,  You came to [place] with hip fracture. Our bone surgeons fixed the fracture and you look great and safe to go to rehab facility. Here are our recommendations at discharge:  - Please ensure you follow up with your orthopedist  Thank you for choosing [facility]  Discharge Instructions    Call MD for:   difficulty breathing, headache or visual disturbances   Complete by:  As directed    Call MD for:  persistant nausea and vomiting   Complete by:  As directed    Call MD for:  redness, tenderness, or signs of infection (pain, swelling, redness, odor or green/yellow discharge around incision site)   Complete by:  As directed    Call MD for:  severe uncontrolled pain   Complete by:  As directed    Call MD for:  temperature >100.4   Complete by:  As directed    Diet - low sodium heart healthy   Complete by:  As directed    Increase activity slowly   Complete  by:  As directed       Signed: Theotis BarrioLee, Darris Carachure K, MD 10/14/2018, 10:36 AM   Pager: [phone]

## 2018-10-11 NOTE — Social Work (Signed)
3:30pm- CSW met with pt husband, provided him with offers for SNF and CMS list. We discussed selecting a top two choices and that insurance is pending for pt. He states understanding, will review with his daughter.  4:45pm- CSW received call from [place], Therapist, sports. Pt family requesting to speak with CSW. CSW off the floor so was called through to the room. Spoke with pt daughter Joelene Millin, we discussed offers. Family first choice is [facility], they inquired about private room and CSW will look into that through liaison with [facility].  CSW also received call, pt has been approved for placement pending facility choice.  Westley Hummer, MSW, [place] Work [phone]

## 2018-10-12 DIAGNOSIS — D62 Acute posthemorrhagic anemia: Secondary | ICD-10-CM

## 2018-10-12 LAB — CBC
HCT: 30 % — ABNORMAL LOW (ref 36.0–46.0)
Hemoglobin: 9.9 g/dL — ABNORMAL LOW (ref 12.0–15.0)
MCH: 31.1 pg (ref 26.0–34.0)
MCHC: 33 g/dL (ref 30.0–36.0)
MCV: 94.3 fL (ref 80.0–100.0)
Platelets: 163 10*3/uL (ref 150–400)
RBC: 3.18 MIL/uL — ABNORMAL LOW (ref 3.87–5.11)
RDW: 12.6 % (ref 11.5–15.5)
WBC: 13.3 10*3/uL — ABNORMAL HIGH (ref 4.0–10.5)
nRBC: 0.2 % (ref 0.0–0.2)

## 2018-10-12 NOTE — Social Work (Addendum)
1:56pm- updated [facility] with preferred facility, received Berkley Harveyauth #[id number]  1:48pm- spoke with pt daughter Cala BradfordKimberly, [facility] tou[facility] will have [facility] room tomorrow. Accepted on hub.   11:11am- There is an available private room at SNF, liaison will reach out to pt husband for possible d/c tomorrow pending medical stability.   10:00am- [facility]. Pt has no additional SNF offers at this time.  Octavio GravesIsabel Jahking Lesser, MSW, [facility] [place] Clinical Social Work [phone]

## 2018-10-12 NOTE — Progress Notes (Signed)
Physical Therapy Treatment Patient Details Name: Kelsey Norris MRN: [id number] DOB: 01/10/1941 Today's Date: 10/12/2018    History of Present Illness 77 y.o. female who fell and was found to have a R hip fracture, now s/p cannulated hip pinning on 12/15. PMHx includes dementia, Arthritis, Asthma, Carpal tunnel syndrome, Hiatal hernia, and Sleep apnea.    PT Comments    Attempted to progress mobility today with weightbearing through R LE, however pt requires total assist when she experiences increase in R hip pain with movement. Pt currently total Ax2 for bed mobility and attempts to stand. Pt had period of decreased arousal during sitting on EoB (see General Comments) likely due to falling asleep. D/c plans remain appropriate at this time. PT will continue to follow acutely.   Follow Up Recommendations  SNF     Equipment Recommendations  None recommended by PT       Precautions / Restrictions Precautions Precautions: Fall Required Braces or Orthoses: Other Brace Other Brace: per spouse, pt wears R knee brace at baseline (in room) Restrictions Weight Bearing Restrictions: Yes RLE Weight Bearing: Weight bearing as tolerated    Mobility  Bed Mobility Overal bed mobility: Needs Assistance Bed Mobility: Supine to Sit;Sit to Supine     Supine to sit: Total assist;+2 for physical assistance;+2 for safety/equipment Sit to supine: Total assist;+2 for physical assistance;+2 for safety/equipment   General bed mobility comments: pt not following commands to assist with movements, assist with trunk and LEs over EOB; use of bed pad to support/scoot hips; pt initially with strong posterior lean upon sitting up right requiring maxA for balance, progressed to minguard with time  Transfers Overall transfer level: Needs assistance Equipment used: 2 person hand held assist Transfers: Sit to/from Stand Sit to Stand: Total assist;+2 physical assistance;+2 safety/equipment         General  transfer comment: Total assist for 3x attempts of sit>stand, pt initially participates with rocking to come to standing but once any weight beared through R LE pt stops participating and unable to attain standing with Total Ax2,       Balance Overall balance assessment: Needs assistance Sitting-balance support: Feet supported Sitting balance-Leahy Scale: Poor Sitting balance - Comments: initially requires maxA for sitting balance with posterior lean, progressed to close minguard with increased time, though when fatigued requires increased assist Postural control: Posterior lean Standing balance support: Bilateral upper extremity supported Standing balance-Leahy Scale: Zero Standing balance comment: requires max-totalA+2                            Cognition Arousal/Alertness: Awake/alert Behavior During Therapy: Flat affect Overall Cognitive Status: History of cognitive impairments - at baseline                                 General Comments: pt with baseline dementia; alert during session but does not follow one step commands this session          General Comments General comments (skin integrity, edema, etc.): Husband present during beginning of session however left to look at SNF. After attempting standing PT sitting with pt on side of bed providing back support with PT shoulder, attempting to facilitate truncal rotation, when pt went limp, pt quickly returned to bed and unarousable, RN notified, pt closed eyes tracking like in REM sleep, continued to attempt to arouse pt when she opened her eyes and  started to smile and then giggle.       Pertinent Vitals/Pain Pain Assessment: Faces Faces Pain Scale: Hurts even more Pain Location: R hip Pain Descriptors / Indicators: Guarding;Grimacing Pain Intervention(s): Limited activity within patient's tolerance;Monitored during session;Repositioned           PT Goals (current goals can now be found in the  care plan section) Acute Rehab PT Goals Patient Stated Goal: none stated PT Goal Formulation: With patient/family Time For Goal Achievement: 10/24/18 Potential to Achieve Goals: Fair Progress towards PT goals: Not progressing toward goals - comment(decreased tolerance to weightbearing through R LE)    Frequency    Min 3X/week      PT Plan Current plan remains appropriate    Co-evaluation PT/OT/SLP Co-Evaluation/Treatment: Yes            AM-PAC PT "6 Clicks" Mobility   Outcome Measure  Help needed turning from your back to your side while in a flat bed without using bedrails?: Total Help needed moving from lying on your back to sitting on the side of a flat bed without using bedrails?: Total Help needed moving to and from a bed to a chair (including a wheelchair)?: Total Help needed standing up from a chair using your arms (e.g., wheelchair or bedside chair)?: Total Help needed to walk in hospital room?: Total Help needed climbing 3-5 steps with a railing? : Total 6 Click Score: 6    End of Session Equipment Utilized During Treatment: Gait belt;Other (comment)(husband applied R knee brace pt utilizes at home for stabili) Activity Tolerance: Patient limited by pain;Patient limited by fatigue Patient left: in bed;with call bell/phone within reach;with bed alarm set;with family/visitor present Nurse Communication: Mobility status;Weight bearing status PT Visit Diagnosis: Unsteadiness on feet (R26.81);Other abnormalities of gait and mobility (R26.89);Muscle weakness (generalized) (M62.81);History of falling (Z91.81);Difficulty in walking, not elsewhere classified (R26.2);Pain Pain - Right/Left: Right Pain - part of body: Leg     Time: 1610-9604 PT Time Calculation (min) (ACUTE ONLY): 33 min  Charges:  $Therapeutic Activity: 23-37 mins                     Kelsey Norris. Beverely Risen PT, DPT Acute Rehabilitation Services Pager [phone] Office [phone]    Elon Alas Fleet 10/12/2018, 11:15 AM

## 2018-10-12 NOTE — Progress Notes (Signed)
Patient ID: Kelsey Norris, female   DOB: [birth year], 77 y.o.   MRN: [id number]   LOS: 3 days   Subjective: Pt demented. Husband in room, has noticed a bit more bleeding from surgical site. Pt appears comfortable.   Objective: Vital signs in last 24 hours: Temp:  [97.7 F (36.5 C)-99.5 F (37.5 C)] 99.5 F (37.5 C) (12/18 0549) Pulse Rate:  [65-74] 65 (12/18 0549) Resp:  [14-17] 16 (12/18 0549) BP: (101-123)/(57-90) 101/63 (12/18 0549) SpO2:  [98 %-100 %] 98 % (12/18 0549) Weight:  [59.9 kg] 59.9 kg (12/18 0633) Last BM Date: 10/08/18   Laboratory  CBC Recent Labs    10/11/18 0124 10/12/18 0332  WBC 15.4* 13.3*  HGB 10.7* 9.9*  HCT 34.2* 30.0*  PLT 170 163   BMET Recent Labs    10/10/18 0143 10/11/18 0124  NA 137 141  K 4.0 4.3  CL 100 106  CO2 23 23  GLUCOSE 186* 123*  BUN 13 16  CREATININE 1.06* 1.08*  CALCIUM 9.6 9.4     Physical Exam General appearance: alert and no distress  RLE: Incision clean, no erythema. Small amount of fresh blood on dressing. Small-to-mod hematoma noted at site. No apparent TTP.   Assessment/Plan: S/p cannulated hip pinning -- Hematoma formation should be self-limiting, no intervention necessary. Will check hgb tomorrow to make sure it has stabilized. No need to hold SQH at this point.    Freeman CaldronMichael J. Noga Fogg, PA-C Orthopedic Surgery [phone] 10/12/2018

## 2018-10-12 NOTE — Plan of Care (Signed)
  Problem: Activity: Goal: Ability to tolerate increased activity will improve Outcome: Progressing   Problem: Pain Management: Goal: Pain level will decrease Outcome: Progressing   

## 2018-10-12 NOTE — Progress Notes (Signed)
  Date: 10/12/2018  Patient name: Candida PeelingBeverly Holtry  Medical record number: [id number]  Date of birth: [birth year]   I have seen and evaluated this patient and I have discussed the plan of care with the house staff. Please see Dr. Marigene EhlersLee's note for complete details. I concur with his findings.  [name], Haneen Bernales B, MD 10/12/2018, 9:55 PM

## 2018-10-12 NOTE — Progress Notes (Signed)
   Subjective:  Kelsey Norris is a 77 y.o. with PMH of dementia, hypothyroidism admit for hip fracture on hospital day 3  Kelsey Norris was examined and evaluated at bedside this AM with husband present. Kelsey Norris was observed resting comfortably in bed. Upon awakening, appeared pleasant and not in distress. Her husband states they were unable to get approval for specific SNF they [facility] and they will attempt to find a location they like by visiting SNFs in the area. No obvious distress or notable changes overnight per husband.  Objective:  Vital signs in last 24 hours: Vitals:   10/12/18 0500 10/12/18 0549 10/12/18 0633 10/12/18 1321  BP:  101/63  (!) 100/53  Pulse:  65  67  Resp:  16  18  Temp:  99.5 F (37.5 C)  98.6 F (37 C)  TempSrc:  Oral  Oral  SpO2:  98%  97%  Weight: 59.9 kg  59.9 kg   Height:       Physical Exam  Constitutional: Kelsey Norris is well-developed, well-nourished, and in no distress. No distress.  HENT:  Head: Normocephalic and atraumatic.  Mouth/Throat: Oropharynx is clear and moist.  Eyes: Pupils are equal, round, and reactive to light. Conjunctivae are normal. No scleral icterus.  Neck: Normal range of motion. Neck supple.  Faded thyroidectomy scar  Cardiovascular: Normal rate, regular rhythm, normal heart sounds and intact distal pulses.  No murmur heard. Pulmonary/Chest: Effort normal and breath sounds normal. Kelsey Norris has no rales.  Abdominal: Soft. Bowel sounds are normal. There is no abdominal tenderness.  Musculoskeletal: Normal range of motion.  Neurological:  Pleasantly Demented  Skin: Skin is warm and dry. Kelsey Norris is not diaphoretic.  Right Hip Surgical site with increasing surrounding edema and fresh bright red bloody exudate on bandaging through the surgical site   Assessment/Plan:  Principal Problem:   Fracture of femoral neck, right, closed (HCC) Active Problems:   Dementia (HCC)   Femoral fracture (HCC)  Kelsey Norris is a 77 yo F w/ PMH of  dementia, hypothyroidism, and asthma admit for hip fracture s/p cannulated hip pinning. Fresh bloody exudate was noted during exam from surgical site with concern for hematoma as her hemoglobin has been declining. Will reach out to ortho for evaluation.  R hip fracture 2/372mechanical fall vs syncopal episode S/p cannulated hip pinning by ortho yesterday. Appear comfortable on exam - Appreciate ortho recs - PT/OT eval: SNF for discharge - Appreciate Social Work support for SNF placement - C/w calcium carbonate 500mg   Post-operative Anemia  Increased edema around surgical site concerning for hematoma. Hgb downtrending 12.6->10.7->9.9 - Appreciate ortho re-evaluation - Trend CBC  Altered mental status 2/2 dementia Declining mentation since 2007. On donepezil and memantine at home.  - C/w home meds: donepezil 10mg  qhs, memantine 10mg  BID  Hypothyroidism Hx of goiter w/ surgical thyroidectomy and radioactive iodine due to recurrence - C/w home meds: levothyroxine 112mcg daily  DVT prophx: subqhep Diet: Senokot Bowel: Soft Code: Full  Dispo: Anticipated discharge in approximately 1-2 day(s).   Kelsey Norris, Kelsey Hofman K, MD 10/12/2018, 3:14 PM Pager: [phone]

## 2018-10-12 NOTE — Care Management Important Message (Signed)
Important Message  Patient Details  Name: Kelsey Norris MRN: [id number] Date of Birth: 1941-02-21   Medicare Important Message Given:  Yes    Venora Kautzman 10/12/2018, 4:11 PM

## 2018-10-13 DIAGNOSIS — Z9889 Other specified postprocedural states: Secondary | ICD-10-CM

## 2018-10-13 DIAGNOSIS — D649 Anemia, unspecified: Secondary | ICD-10-CM

## 2018-10-13 LAB — HEMOGLOBIN AND HEMATOCRIT, BLOOD
HEMATOCRIT: 26.9 % — AB (ref 36.0–46.0)
HEMOGLOBIN: 8.8 g/dL — AB (ref 12.0–15.0)

## 2018-10-13 LAB — CBC
HCT: 25.8 % — ABNORMAL LOW (ref 36.0–46.0)
Hemoglobin: 8.3 g/dL — ABNORMAL LOW (ref 12.0–15.0)
MCH: 30.5 pg (ref 26.0–34.0)
MCHC: 32.2 g/dL (ref 30.0–36.0)
MCV: 94.9 fL (ref 80.0–100.0)
Platelets: 181 10*3/uL (ref 150–400)
RBC: 2.72 MIL/uL — ABNORMAL LOW (ref 3.87–5.11)
RDW: 12.7 % (ref 11.5–15.5)
WBC: 12.2 10*3/uL — ABNORMAL HIGH (ref 4.0–10.5)
nRBC: 0 % (ref 0.0–0.2)

## 2018-10-13 LAB — PREPARE RBC (CROSSMATCH)

## 2018-10-13 LAB — ABO/RH: ABO/RH(D): O POS

## 2018-10-13 MED ORDER — ASPIRIN 325 MG PO TABS
325.0000 mg | ORAL_TABLET | Freq: Every day | ORAL | Status: DC
  Administered 2018-10-14: 325 mg via ORAL
  Filled 2018-10-13: qty 1

## 2018-10-13 MED ORDER — SODIUM CHLORIDE 0.9% IV SOLUTION
Freq: Once | INTRAVENOUS | Status: AC
  Administered 2018-10-13: 17:00:00 via INTRAVENOUS

## 2018-10-13 NOTE — Progress Notes (Signed)
Nutrition Brief Note  Patient identified on the Low Braden report (score of 12 or less).   Wt Readings from Last 15 Encounters:  10/13/18 58.9 kg  03/30/18 66.2 kg  11/05/13 65.3 kg  10/31/12 65.8 kg  10/04/12 65.7 kg  09/07/12 64.9 kg  02/25/11 61.7 kg  12/31/10 62.2 kg   Kelsey Norris is a 77 yo F w/ PMH of hypothyoridism, osa, asthma, hld, and dementia who presented to ED after a fall. She was examined and evaluated at ED bedside with husband and son present. She was observed laying comfortably in bed with her eyes closed. She has severe dementia at baseline and was unable to answer questions but responds to her name and intermittently awakened and smiled during physical exam. Per her husband, while he was getting her ready for the day, he turned around to grab her hair products and heard a thump. He turned around and found her on the floor. He is unsure if she hit her head on way down, but she appeared more somnolent and lethargic compared to baseline after so he brought her to the ED for evaluation. His son mentions that she appeared to have been endorsing shuffling gait for the last couple months with recurrent falls but he states they usually work up her brain and head which have been negative and this is the first time her lower extremities have been imaged.   12/15- s/p PROCEDURE:  CANNULATED HIP PINNING  Pt receiving nursing care at time of visit. Pt with good appetite, consuming 50-100% of meals. Pt awaiting SNF placement.   Body mass index is 23.75 kg/m. Patient meets criteria for normal weight range based on current BMI.   Current diet order is soft, patient is consuming approximately 50-100% of meals at this time. Labs and medications reviewed.   No nutrition interventions warranted at this time. If nutrition issues arise, please consult RD.   Kelsey Norris, RD, LDN, CDE Pager: [phone] After hours Pager: [phone]

## 2018-10-13 NOTE — Progress Notes (Signed)
Blood transfusion currently running, H&H rescheduled at 2230.

## 2018-10-13 NOTE — Progress Notes (Signed)
  Date: 10/13/2018  Patient name: Kelsey Norris  Medical record number: [id number]  Date of birth: 1941/10/22   I have seen and evaluated this patient and I have discussed the plan of care with the house staff. Please see Dr. Marigene EhlersLee's note for complete details. I concur with his findings with the following additions/corrections:   Patient with symptomatic anemia this AM, post operative.  She had conjunctival pallor, poor cap refill and fatigue with PT.  Site should tamponade itself, but she is not stable for discharge at this time.   [name], Emily B, MD 10/13/2018, 8:36 PM

## 2018-10-13 NOTE — Progress Notes (Signed)
Patient ID: Kelsey Norris, female   DOB: 08-25-1941, 77 y.o.   MRN: [id number]   LOS: 4 days   Subjective: Asked by primary team to see again, hgb down 1.6g from yesterday, pt seems more lethargic. Husband seconds that, says they are going to give her some blood. No apparent increase in discomfort.   Objective: Vital signs in last 24 hours: Temp:  [97.7 F (36.5 C)-98.6 F (37 C)] 97.7 F (36.5 C) (12/19 0521) Pulse Rate:  [77-78] 77 (12/19 0521) Resp:  [16] 16 (12/19 0521) BP: (116-118)/(49) 118/49 (12/19 0521) SpO2:  [100 %] 100 % (12/19 0521) Weight:  [58.9 kg] 58.9 kg (12/19 0521) Last BM Date: 10/13/18   Laboratory  CBC Recent Labs    10/12/18 0332 10/13/18 0249  WBC 13.3* 12.2*  HGB 9.9* 8.3*  HCT 30.0* 25.8*  PLT 163 181   BMET Recent Labs    10/11/18 0124  NA 141  K 4.3  CL 106  CO2 23  GLUCOSE 123*  BUN 16  CREATININE 1.08*  CALCIUM 9.4     Physical Exam General appearance: alert and no distress  RLE: Thigh hematoma not appreciably bigger, still with some sanguinous discharge from surgical site. No ecchymoses noted.   Assessment/Plan: S/p right hip pinning -- Will try to get some TED hose over surgical site to provide some compression. Transfuse as clinically indicated. No need for surgical intervention at this point.    Freeman CaldronMichael J.  Bachmeier, PA-C Orthopedic Surgery [phone] 10/13/2018

## 2018-10-13 NOTE — Social Work (Signed)
CSW continuing to follow for support with disposition when medically appropriate. [facility] and we will work on discharge when summary available.  Kelsey GravesIsabel Alejandro Norris, MSW, [facility] [place] Clinical Social Work [phone]

## 2018-10-13 NOTE — Progress Notes (Signed)
Occupational Therapy Treatment Patient Details Name: Kelsey Norris MRN: [id number] DOB: 19-Nov-1940 Today's Date: 10/13/2018    History of present illness 77 y.o. female who fell and was found to have a R hip fracture, now s/p cannulated hip pinning on 12/15. PMHx includes dementia, Arthritis, Asthma, Carpal tunnel syndrome, Hiatal hernia, and Sleep apnea.   OT comments  Pt lethargic today and difficult to arouse. Pt opening eyes and performing bed mobility with MaxA w/ HOB elevated from supine to sitting EOB fair minus balance. Pt tolerating x8 mins seated before head drooped and returned to supine. Pt repositioned in bed for comfort with totalA closer to head of bed. Pt alert and responding to spouse in room, but very lethargic today. Pt continues to require MaxA to totalA with ADLs and standing with maxA to totalA +2 from previous sessions. Pt continues to progress and would benefit from SNF in order to increase independence with ADLs and mobility.   Follow Up Recommendations  SNF    Equipment Recommendations       Recommendations for Other Services      Precautions / Restrictions Precautions Precautions: Fall Required Braces or Orthoses: Other Brace Other Brace: per spouse, pt wears R knee brace at baseline (in room) Restrictions Weight Bearing Restrictions: Yes RLE Weight Bearing: Weight bearing as tolerated       Mobility Bed Mobility Overal bed mobility: Needs Assistance Bed Mobility: Supine to Sit;Sit to Supine     Supine to sit: Total assist;Max assist Sit to supine: Total assist;Max assist   General bed mobility comments: pt following ~50% of commands as pt with increased alertness towards end of session. Pt   Transfers Overall transfer level: Needs assistance               General transfer comment: Unable to perform as pt falling asleep at EOB with head drooping after 8 mins of sitting    Balance Overall balance assessment: Needs  assistance Sitting-balance support: Feet supported;Single extremity supported Sitting balance-Leahy Scale: Fair(fair minus) Sitting balance - Comments: Initially requiring tactile cues to sit upright without posterior lean, then pt with fair balance x8 mins at EOB Postural control: Posterior lean                                 ADL either performed or assessed with clinical judgement   ADL Overall ADL's : Needs assistance/impaired                                     Functional mobility during ADLs: Maximal assistance;Total assistance(supine to sitting EOB) General ADL Comments: pt limited due to pain as well as increased limitations due to her baseline cognitive status; currently requires maxA to totalA  for all ADL tasks     Vision       Perception     Praxis      Cognition Arousal/Alertness: Lethargic Behavior During Therapy: Flat affect Overall Cognitive Status: History of cognitive impairments - at baseline                                 General Comments: pt with baseline dementia; alert during session but does not follow one step commands this session         Exercises  Shoulder Instructions       General Comments Pt nearly unresponsive after sitting EOB x8 mins so pt returned to supine and she began opening her eyes and turning head to her name.     Pertinent Vitals/ Pain       Pain Assessment: Faces Faces Pain Scale: Hurts little more Pain Location: R hip Pain Descriptors / Indicators: Guarding;Grimacing Pain Intervention(s): Repositioned;Monitored during session;Limited activity within patient's tolerance  Home Living                                          Prior Functioning/Environment              Frequency  Min 2X/week        Progress Toward Goals  OT Goals(current goals can now be found in the care plan section)  Progress towards OT goals: Progressing toward  goals  Acute Rehab OT Goals Patient Stated Goal: none stated OT Goal Formulation: With patient Time For Goal Achievement: 10/24/18 Potential to Achieve Goals: Fair ADL Goals Additional ADL Goal #2: progressing  Plan Discharge plan remains appropriate(SNF)    Co-evaluation                 AM-PAC OT "6 Clicks" Daily Activity     Outcome Measure   Help from another person eating meals?: A Lot Help from another person taking care of personal grooming?: Total Help from another person toileting, which includes using toliet, bedpan, or urinal?: Total Help from another person bathing (including washing, rinsing, drying)?: Total Help from another person to put on and taking off regular upper body clothing?: Total Help from another person to put on and taking off regular lower body clothing?: Total 6 Click Score: 7    End of Session Equipment Utilized During Treatment: Gait belt  OT Visit Diagnosis: Unsteadiness on feet (R26.81);Muscle weakness (generalized) (M62.81)   Activity Tolerance Patient limited by fatigue;Patient limited by pain   Patient Left in bed;with call bell/phone within reach;with bed alarm set   Nurse Communication Mobility status        Time: [id number] OT Time Calculation (min): 24 min  Charges: OT General Charges $OT Visit: 1 Visit OT Treatments $Therapeutic Activity: 23-37 mins  Kelsey StandardAllison Cecil Cranker(Jelenek) [place] OTR/L Acute Rehabilitation Services Pager: [phone] Office: [phone]  Sandrea HughsALLYSON  JELENEK 10/13/2018, 11:30 AM

## 2018-10-13 NOTE — Progress Notes (Signed)
   Subjective:  Candida PeelingBeverly Norris is a 77 y.o. with PMH ofdementia, hypothyroidism admit for hip fracture on hospital day 4  Ms.Krummel was evaluated at bedside this AM w/ husband present. She was observed appearing much more lethargic this morning. Appear very pale on evaluation. Awaken to stimulation but not very interactive. Her husband states they received placement for SNF. Discussed possibility of hematoma formation at site of surgery in light of declining hemoglobin. He agrees with plan to transfuse blood.  Objective:  Vital signs in last 24 hours: Vitals:   10/12/18 0633 10/12/18 1321 10/12/18 2220 10/13/18 0521  BP:  (!) 100/53 (!) 116/49 (!) 118/49  Pulse:  67 78 77  Resp:  18 16 16   Temp:  98.6 F (37 C) 98.6 F (37 C) 97.7 F (36.5 C)  TempSrc:  Oral Oral Axillary  SpO2:  97% 100% 100%  Weight: 59.9 kg   58.9 kg  Height:       Physical Exam  Constitutional: She is well-developed, well-nourished, and in no distress.  Pleasantly demented  HENT:  Head: Normocephalic and atraumatic.  Mouth/Throat: Oropharynx is clear and moist.  Pale mucous membranes  Eyes:  Conjunctival pallor  Neck: Normal range of motion. Neck supple.  Thyroidectomy scar  Cardiovascular: Normal rate, regular rhythm and intact distal pulses.  No murmur heard. Pulmonary/Chest: Effort normal and breath sounds normal. She has no rales.  Abdominal: Soft. Bowel sounds are normal.  Musculoskeletal:     Comments: Poor cap refill  Neurological:  Somnolent  Skin: Skin is dry.  +Pallor   Assessment/Plan:  Principal Problem:   Fracture of femoral neck, right, closed (HCC) Active Problems:   Dementia (HCC)   Femoral fracture (HCC)  Mrs.Sainz is a 77 yo F w/ PMH of dementia, hypothyroidism, and asthma admit for hip fracture s/p cannulated hip pinning. Continuing to ooze blood from surgical site. Significant signs of symptomatic anemia on exam. Much more lethargic than yesterday. Will give blood  today.  Post-operative Anemia  Possible hematoma around surgical site. Hgb continuing to downtrend 12.6->10.7->9.9->8.3 - 1 unit pRBC  - F/u post transfusion hgb - Appreciate ortho recs - Trend CBC  R hip fracture 2/812mechanical fall vs syncopal episode S/p cannulated hip pinning by ortho 12/17. - Appreciate ortho recs - PT/OT eval: SNF for discharge  -Appreciate Social Work support for SNF placement: Confirmed - C/w calcium carbonate 500mg   Altered mental status 2/2 dementia On donepezil and memantine at home.  - C/w home meds: donepezil 10mg  qhs, memantine 10mg  BID  DVT prophx:subqhep Diet:Senokot Bowel:Soft Code:Full  Dispo: Anticipated discharge in approximately 1-2 day(s).   Theotis BarrioLee, Amaziah Raisanen K, MD 10/13/2018, 6:45 AM Pager: [phone]

## 2018-10-14 DIAGNOSIS — S72011A Unspecified intracapsular fracture of right femur, initial encounter for closed fracture: Principal | ICD-10-CM

## 2018-10-14 LAB — TYPE AND SCREEN
ABO/RH(D): O POS
Antibody Screen: NEGATIVE
Unit division: 0

## 2018-10-14 LAB — BPAM RBC
BLOOD PRODUCT EXPIRATION DATE: 202001142359
ISSUE DATE / TIME: 201912191659
Unit Type and Rh: 5100

## 2018-10-14 LAB — CBC
HCT: 30 % — ABNORMAL LOW (ref 36.0–46.0)
HEMOGLOBIN: 9.6 g/dL — AB (ref 12.0–15.0)
MCH: 29.8 pg (ref 26.0–34.0)
MCHC: 32 g/dL (ref 30.0–36.0)
MCV: 93.2 fL (ref 80.0–100.0)
Platelets: 135 10*3/uL — ABNORMAL LOW (ref 150–400)
RBC: 3.22 MIL/uL — ABNORMAL LOW (ref 3.87–5.11)
RDW: 13.1 % (ref 11.5–15.5)
WBC: 10.3 10*3/uL (ref 4.0–10.5)
nRBC: 0.3 % — ABNORMAL HIGH (ref 0.0–0.2)

## 2018-10-14 MED ORDER — ENOXAPARIN SODIUM 40 MG/0.4ML ~~LOC~~ SOLN: 40.0000 mg | SUBCUTANEOUS | 0 refills | Status: AC

## 2018-10-14 MED ORDER — HYDROCODONE-ACETAMINOPHEN 5-325 MG PO TABS: 1.0000 | ORAL_TABLET | ORAL | 0 refills | Status: AC | PRN

## 2018-10-14 NOTE — Progress Notes (Signed)
I gave report in [facility] to W.W. Grainger IncDiane Knott.

## 2018-10-14 NOTE — Progress Notes (Signed)
   Subjective:  Kelsey Norris is a 77 y.o. with PMH of dementia, hypothyroidism admit for hip fracture on hospital day 5  Kelsey Norris was examined and evaluated at bedside this AM with husband present. She appears much more alert and interactive today. Husband mentions that she appears significantly improved and back to baseline. Denies any acute events overnight.   Objective:  Vital signs in last 24 hours: Vitals:   10/13/18 1657 10/13/18 1724 10/13/18 2008 10/14/18 0424  BP: (!) 135/59 127/66 117/87 (!) 108/42  Pulse: 82 85 89 66  Resp: 16 16 16 14   Temp: 98.4 F (36.9 C) 98.7 F (37.1 C) 98.6 F (37 C) 98.3 F (36.8 C)  TempSrc: Axillary Axillary Oral Oral  SpO2: 92% 100% 100% 100%  Weight:      Height:       Physical Exam  Constitutional: She is well-developed, well-nourished, and in no distress. No distress.  Pleasantly improved  Eyes: Pupils are equal, round, and reactive to light.  Conjunctival pallor much improved  Neck: Normal range of motion. Neck supple.  Cardiovascular: Normal rate, regular rhythm, normal heart sounds and intact distal pulses.  No murmur heard. Pulmonary/Chest: Effort normal and breath sounds normal. She has no rales.  Abdominal: Soft. Bowel sounds are normal. There is no abdominal tenderness.  Musculoskeletal: Normal range of motion.        General: No edema.  Skin: She is not diaphoretic.  Right hip surgical site with fresh bandaging without obvious exudates   Assessment/Plan:  Principal Problem:   Fracture of femoral neck, right, closed (HCC) Active Problems:   Dementia (HCC)   Femoral fracture (HCC)   Symptomatic anemia  Kelsey Norris is a 77 yo F w/ PMHof dementia, hypothyroidism, and asthma admit for hip fracture s/p cannulated hip pinning.Appear much more improved after 1 unit of blood and compression with TED hose. No obvious further bleeding from surgical site. Hemoglobin trending up. Safe to be discharged.  Post-operative Anemia  Hgb  trend post 1 unit of blood 8.8->9.6 - Stable  R hip fracture 2/712mechanical fall vs syncopal episode S/p cannulated hip pinning by ortho 12/17. - Discharge to SNF today - C/w calcium carbonate 500mg   Altered mental status 2/2 dementia On donepezil and memantine at home.  - C/w home meds: donepezil 10mg  qhs, memantine 10mg  BID  DVT prophx:subqhep Diet:Senokot Bowel:Soft Code:Full  Dispo: Anticipated discharge in approximately today(s).   Theotis BarrioLee, Beckham Capistran K, MD 10/14/2018, 10:36 AM Pager: [phone]

## 2018-10-14 NOTE — Progress Notes (Signed)
Physical Therapy Treatment Patient Details Name: Kelsey PeelingBeverly Reitman MRN: [id number] DOB: August 05, 1941 Today's Date: 10/14/2018    History of Present Illness 77 y.o. female who fell and was found to have a R hip fracture, now s/p cannulated hip pinning on 12/15. PMHx includes dementia, Arthritis, Asthma, Carpal tunnel syndrome, Hiatal hernia, and Sleep apnea.    PT Comments    Pt much more alert during therapy session today. Pt continues to be total A with mobility. Focus of session to facilitate increased weightbearing through R hip. Pt with increased extension posture with increase in pain. D/c plans remain appropriate at this time.     Follow Up Recommendations  SNF     Equipment Recommendations  None recommended by PT       Precautions / Restrictions Precautions Precautions: Fall Required Braces or Orthoses: Other Brace Other Brace: per spouse, pt wears R knee brace at baseline (in room) Restrictions Weight Bearing Restrictions: Yes RLE Weight Bearing: Weight bearing as tolerated    Mobility  Bed Mobility Overal bed mobility: Needs Assistance Bed Mobility: Supine to Sit;Sit to Supine;Rolling Rolling: Total assist;+2 for physical assistance   Supine to sit: Total assist;+2 for physical assistance;+2 for safety/equipment Sit to supine: Total assist;+2 for physical assistance;+2 for safety/equipment   General bed mobility comments: on entry pt requires total Ax2 for turning for pericare. pt not following commands to assist with movements, total assist with trunk and LEs over EOB; use of bed pad to support/scoot hips; pt initially with strong posterior lean upon sitting up right requiring maxA for balance, progressed to minguard with time  Transfers Overall transfer level: Needs assistance Equipment used: 2 person hand held assist Transfers: Sit to/from Stand Sit to Stand: Total assist;+2 physical assistance;+2 safety/equipment         General transfer comment: Total assist  for 3x attempts of sit>stand, utilized 3 Musketeer approach with pt arms over therapist shoulders and therapist with arms crossed behind back for powerup, pt limited weightbearing through R hip, lateral sway between therapists to facilitate increased weightbearing. by 3rd attempt pt with increased extension making standing impossible        Balance Overall balance assessment: Needs assistance Sitting-balance support: Feet supported Sitting balance-Leahy Scale: Poor Sitting balance - Comments: initially requires maxA for sitting balance with posterior lean, progressed to close minguard with increased time, though when fatigued requires increased assist Postural control: Posterior lean Standing balance support: Bilateral upper extremity supported Standing balance-Leahy Scale: Zero Standing balance comment: requires max-totalA+2                            Cognition Arousal/Alertness: Awake/alert Behavior During Therapy: Flat affect Overall Cognitive Status: History of cognitive impairments - at baseline                                 General Comments: pt with baseline dementia; alert during session but does not follow one step commands this session          General Comments General comments (skin integrity, edema, etc.): Pt much more responsive today after blood transfusion. Spouse present throughout session       Pertinent Vitals/Pain Pain Assessment: Faces Faces Pain Scale: Hurts little more Pain Location: R hip Pain Descriptors / Indicators: Guarding;Grimacing Pain Intervention(s): Limited activity within patient's tolerance;Monitored during session;Repositioned;Premedicated before session  PT Goals (current goals can now be found in the care plan section) Acute Rehab PT Goals Patient Stated Goal: none stated PT Goal Formulation: With patient/family Time For Goal Achievement: 10/24/18 Potential to Achieve Goals: Fair     Frequency    Min 3X/week      PT Plan Current plan remains appropriate    Co-evaluation PT/OT/SLP Co-Evaluation/Treatment: Yes            AM-PAC PT "6 Clicks" Mobility   Outcome Measure  Help needed turning from your back to your side while in a flat bed without using bedrails?: Total Help needed moving from lying on your back to sitting on the side of a flat bed without using bedrails?: Total Help needed moving to and from a bed to a chair (including a wheelchair)?: Total Help needed standing up from a chair using your arms (e.g., wheelchair or bedside chair)?: Total Help needed to walk in hospital room?: Total Help needed climbing 3-5 steps with a railing? : Total 6 Click Score: 6    End of Session Equipment Utilized During Treatment: Gait belt Activity Tolerance: Patient limited by pain;Patient limited by fatigue Patient left: in bed;with call bell/phone within reach;with bed alarm set;with family/visitor present Nurse Communication: Mobility status;Weight bearing status PT Visit Diagnosis: Unsteadiness on feet (R26.81);Other abnormalities of gait and mobility (R26.89);Muscle weakness (generalized) (M62.81);History of falling (Z91.81);Difficulty in walking, not elsewhere classified (R26.2);Pain Pain - Right/Left: Right Pain - part of body: Leg     Time: [id number] PT Time Calculation (min) (ACUTE ONLY): 35 min  Charges:  $Therapeutic Activity: 23-37 mins                     Corsica Franson B. Beverely RisenVan Fleet PT, DPT Acute Rehabilitation Services Pager [phone] Office [phone]    Elon Alaslizabeth B Van Fleet 10/14/2018, 11:31 AM

## 2018-10-14 NOTE — Social Work (Addendum)
11:24am- Clinical Social Worker facilitated patient discharge including contacting patient family and facility to confirm patient discharge plans.  Clinical information faxed to facility and family agreeable with plan.  CSW [facility] to call [phone]  with report prior to discharge. Pick up at 2pm due to room needing to be cleaned at SNF.  Clinical Social Worker will sign off for now as social work intervention is no longer needed. Please consult us again if new need arises.  10:00am- Updated pt authorization through [facility] Medicare and [facility], CSW continuing to follow for support with disposition when medically appropriate.  Octavio GravesIsabel Kirstan Fentress, MSW, [facility] [place] Clinical Social Work [phone]

## 2018-10-14 NOTE — Discharge Instructions (Signed)
Hip Fracture  A hip fracture is a break in the upper part of the thigh bone (femur). This is usually the result of an injury, commonly a fall. What are the causes? This condition may be caused by:  A direct hit or injury (trauma) to the side of the hip, such as from a fall or a car accident. What increases the risk? You are more likely to develop this condition if:  You have poor balance or an unsteady walking pattern (gait). Certain conditions contribute to poor balance, including Parkinson disease and dementia.  You have thinning or weakening of your bones, such as from osteopenia or osteoporosis.  You have cancer that spreads to the leg bones.  You have certain conditions that can weaken your bones, such as thyroid disorders, intestine disorders, or a lack (deficiency) of certain nutrients.  You smoke.  You take certain medicines, such as steroids.  You have a history of broken bones. What are the signs or symptoms? Symptoms of this condition include:  Pain over the injured hip. This is commonly felt on the side of the hip or in the front groin area.  Stiffness, bruising, and swelling over the hip.  Pain with movement of the leg, especially lifting it up. Pain often gets better with rest.  Difficulty or inability to stand, walk, or use the leg to support body weight (put weight on the leg).  The leg rolling outward when lying down.  The affected leg being shorter than the other leg. How is this diagnosed? This condition may be diagnosed based on:  Your symptoms.  A physical exam.  X-rays. These may be done: ? To confirm the diagnosis. ? To determine the type and location of the fracture. ? To check for other injuries.  MRI or CT scans. These may be done if the fracture is not visible on an X-ray. How is this treated? Treatment for this condition depends on the severity and location of your fracture. In most cases, surgery is necessary. Surgery may  involve:  Repairing the fracture with a screw, nail, or rod to hold the bone in place (open reduction and internal fixation, ORIF).  Replacing the damaged parts of the femur with metal implants (hemiarthroplasty or arthroplasty). If your fracture is less severe, or if you are not eligible for surgery, you may have non-surgical treatment. Non-surgical treatment may involve:  Using crutches, a walker, or a wheelchair until your health care provider says that you can support (bear) weight on your hip.  Medicines to help reduce pain and swelling.  Having regular X-rays to monitor your fracture and make sure that it is healing.  Physical therapy. You may need physical therapy after surgery, too. Follow these instructions at home: Activity  Do not use your injured leg to support your body weight until your health care provider says that you can. ? Follow standing and walking restrictions as told by your health care provider. ? Use crutches, a walker, or a wheelchair as directed.  Avoid any activities that cause pain or irritation in your hip. Ask your health care provider what activities are safe for you.  Do not drive or use heavy machinery until your health care provider approves.  If physical therapy was prescribed, do exercises as told by your health care provider. General instructions  Take over-the-counter and prescription medicines only as told by your health care provider.  If directed, put ice on the injured area: ? Put ice in a plastic bag. ?   Place a towel between your skin and the bag. ? Leave the ice on for 20 minutes, 2-3 times a day.  Do not use any products that contain nicotine or tobacco, such as cigarettes and e-cigarettes. These can delay bone healing. If you need help quitting, ask your health care provider.  Keep all follow-up visits as told by your health care provider. This is important. How is this prevented?  To prevent falls at home: ? Use a cane, walker,  or wheelchair as directed. ? Make sure your rooms and hallways are free of clutter, obstacles, and cords. ? Install grab bars in your bedroom and bathrooms. ? Always use handrails when going up and down stairs. ? Use nightlights around the house.  Exercise regularly. Ask what forms of exercise are safe for you, such as walking and strength and balance exercises.  Visit an eye doctor regularly to have your eyesight checked. This can help prevent falls.  Make sure you get enough calcium and vitamin D.  Do not use any products that contain nicotine or tobacco, such as cigarettes and e-cigarettes. If you need help quitting, ask your health care provider.  Limit alcohol use.  If you have an underlying condition that caused your hip fracture, work with your health care provider to manage your condition. Contact a health care provider if:  Your pain gets worse or it does not get better with rest or medicine.  You develop any of the following in your leg or foot: ? Numbness. ? Tingling. ? A change in skin color (discoloration). ? Skin feeling cold to the touch. Get help right away if:  Your pain suddenly gets worse.  You cannot move your hip. Summary  A hip fracture is a break in the upper part of the thigh bone (femur).  Treatment typically require surgical management to restore stability and function to the hip.  Pain medicine and icing of the affected leg can help manage pain and swelling. Follow directions as told by your health care provider. This information is not intended to replace advice given to you by your health care provider. Make sure you discuss any questions you have with your health care provider. Document Released: 10/12/2005 Document Revised: 07/02/2018 Document Reviewed: 11/14/2016 Elsevier Interactive Patient Education  2019 [employer]  

## 2018-10-14 NOTE — Clinical Social Work Placement (Signed)
   CLINICAL SOCIAL WORK PLACEMENT  NOTE [facility] RN to call report to [phone]  Date:  10/14/2018  Patient Details  Name: Kelsey PeelingBeverly Dias MRN: [id number] Date of Birth: [birth year]  Clinical Social Work is seeking post-discharge placement for this patient at the Skilled  Nursing Facility level of care (*CSW will initial, date and re-position this form in  chart as items are completed):  Yes   Patient/family provided with [place] Clinical Social Work Department's list of facilities offering this level of care within the geographic area requested by the patient (or if unable, by the patient's family).  Yes   Patient/family informed of their freedom to choose among providers that offer the needed level of care, that participate in Medicare, Medicaid or managed care program needed by the patient, have an available bed and are willing to accept the patient.  Yes   Patient/family informed of [facility]'s ownership interest in [facility] and [facility], as well as of the fact that they are under no obligation to receive care at these facilities.  PASRR submitted to EDS on       PASRR number received on 10/10/18     Existing PASRR number confirmed on       FL2 transmitted to all facilities in geographic area requested by pt/family on 10/10/18     FL2 transmitted to all facilities within larger geographic area on       Patient informed that his/her managed care company has contracts with or will negotiate with certain facilities, including the following:        Yes   Patient/family informed of bed offers received.  [facility]     Physician recommends and patient chooses bed at      [facility].  Patient to be transferred to facility by [facility]     Patient family notified on 10/14/18 of transfer.  Name of family member notified:  pt husband     PHYSICIAN Please prepare priority discharge summary,  including medications, Please prepare prescriptions     Additional Comment:    _______________________________________________ Doy HutchingIsabel H Miller [place], LCSWA 10/14/2018, 11:27 AM

## 2018-10-14 NOTE — Progress Notes (Signed)
Pt discharged accompanied by [facility] via stretcher, husband at the bedside, pt alert and responsive no s/s of distress noted.

## 2018-10-14 NOTE — Progress Notes (Signed)
  Date: 10/14/2018  Patient name: Kelsey Norris  Medical record number: [id number]  Date of birth: 11-15-1940   I have seen and evaluated this patient and I have discussed the plan of care with the house staff. Please see Dr. Marigene EhlersLee's note for complete details. I concur with his findings.    [name], Brette Cast B, MD 10/14/2018, 8:27 PM

## 2018-10-14 NOTE — Progress Notes (Signed)
Pt for discharge going to [facility] trying to give report 3x but unsuccessful , social worker [place].

## 2018-10-24 ENCOUNTER — Observation Stay (HOSPITAL_COMMUNITY)
Admission: EM | Admit: 2018-10-24 | Discharge: 2018-10-27 | Disposition: A | Discharge status: Skilled Nursing Facility | Payer: Medicare HMO | Attending: Internal Medicine | Admitting: Internal Medicine

## 2018-10-24 ENCOUNTER — Emergency Department (HOSPITAL_COMMUNITY): Payer: Medicare HMO

## 2018-10-24 ENCOUNTER — Encounter (HOSPITAL_COMMUNITY): Payer: Self-pay | Admitting: *Deleted

## 2018-10-24 ENCOUNTER — Other Ambulatory Visit: Payer: Self-pay

## 2018-10-24 DIAGNOSIS — Z79899 Other long term (current) drug therapy: Secondary | ICD-10-CM | POA: Diagnosis not present

## 2018-10-24 DIAGNOSIS — F0390 Unspecified dementia without behavioral disturbance: Secondary | ICD-10-CM | POA: Diagnosis not present

## 2018-10-24 DIAGNOSIS — D649 Anemia, unspecified: Secondary | ICD-10-CM | POA: Diagnosis not present

## 2018-10-24 DIAGNOSIS — R52 Pain, unspecified: Secondary | ICD-10-CM

## 2018-10-24 DIAGNOSIS — G5600 Carpal tunnel syndrome, unspecified upper limb: Secondary | ICD-10-CM | POA: Diagnosis not present

## 2018-10-24 DIAGNOSIS — I951 Orthostatic hypotension: Secondary | ICD-10-CM

## 2018-10-24 DIAGNOSIS — G4733 Obstructive sleep apnea (adult) (pediatric): Secondary | ICD-10-CM

## 2018-10-24 DIAGNOSIS — L8990 Pressure ulcer of unspecified site, unspecified stage: Secondary | ICD-10-CM

## 2018-10-24 DIAGNOSIS — E785 Hyperlipidemia, unspecified: Secondary | ICD-10-CM | POA: Insufficient documentation

## 2018-10-24 DIAGNOSIS — R55 Syncope and collapse: Principal | ICD-10-CM | POA: Diagnosis present

## 2018-10-24 DIAGNOSIS — Z7982 Long term (current) use of aspirin: Secondary | ICD-10-CM | POA: Diagnosis not present

## 2018-10-24 DIAGNOSIS — E039 Hypothyroidism, unspecified: Secondary | ICD-10-CM | POA: Insufficient documentation

## 2018-10-24 DIAGNOSIS — J45909 Unspecified asthma, uncomplicated: Secondary | ICD-10-CM | POA: Diagnosis not present

## 2018-10-24 LAB — URINALYSIS, ROUTINE W REFLEX MICROSCOPIC
Bilirubin Urine: NEGATIVE
Glucose, UA: NEGATIVE mg/dL
Hgb urine dipstick: NEGATIVE
Ketones, ur: NEGATIVE mg/dL
LEUKOCYTES UA: NEGATIVE
NITRITE: NEGATIVE
Protein, ur: NEGATIVE mg/dL
Specific Gravity, Urine: 1.018 (ref 1.005–1.030)
pH: 7 (ref 5.0–8.0)

## 2018-10-24 LAB — CBC WITH DIFFERENTIAL/PLATELET
Abs Immature Granulocytes: 0.07 10*3/uL (ref 0.00–0.07)
Basophils Absolute: 0 10*3/uL (ref 0.0–0.1)
Basophils Relative: 0 %
Eosinophils Absolute: 0.1 10*3/uL (ref 0.0–0.5)
Eosinophils Relative: 1 %
HEMATOCRIT: 32.4 % — AB (ref 36.0–46.0)
Hemoglobin: 10.5 g/dL — ABNORMAL LOW (ref 12.0–15.0)
Immature Granulocytes: 1 %
LYMPHS ABS: 1 10*3/uL (ref 0.7–4.0)
Lymphocytes Relative: 10 %
MCH: 30.9 pg (ref 26.0–34.0)
MCHC: 32.4 g/dL (ref 30.0–36.0)
MCV: 95.3 fL (ref 80.0–100.0)
Monocytes Absolute: 0.7 10*3/uL (ref 0.1–1.0)
Monocytes Relative: 7 %
Neutro Abs: 8.2 10*3/uL — ABNORMAL HIGH (ref 1.7–7.7)
Neutrophils Relative %: 81 %
Platelets: 258 10*3/uL (ref 150–400)
RBC: 3.4 MIL/uL — ABNORMAL LOW (ref 3.87–5.11)
RDW: 14.5 % (ref 11.5–15.5)
WBC: 10.1 10*3/uL (ref 4.0–10.5)
nRBC: 0 % (ref 0.0–0.2)

## 2018-10-24 LAB — COMPREHENSIVE METABOLIC PANEL
ALT: 15 U/L (ref 0–44)
AST: 27 U/L (ref 15–41)
Albumin: 3.3 g/dL — ABNORMAL LOW (ref 3.5–5.0)
Alkaline Phosphatase: 90 U/L (ref 38–126)
Anion gap: 12 (ref 5–15)
BUN: 18 mg/dL (ref 8–23)
CO2: 25 mmol/L (ref 22–32)
Calcium: 9.6 mg/dL (ref 8.9–10.3)
Chloride: 106 mmol/L (ref 98–111)
Creatinine, Ser: 0.85 mg/dL (ref 0.44–1.00)
GFR calc Af Amer: >60 mL/min (ref >60)
GFR calc non Af Amer: >60 mL/min (ref >60)
Glucose, Bld: 100 mg/dL — ABNORMAL HIGH (ref 70–99)
Potassium: 3.8 mmol/L (ref 3.5–5.1)
SODIUM: 143 mmol/L (ref 135–145)
Total Bilirubin: 2 mg/dL — ABNORMAL HIGH (ref 0.3–1.2)
Total Protein: 6.1 g/dL — ABNORMAL LOW (ref 6.5–8.1)

## 2018-10-24 LAB — I-STAT TROPONIN, ED: Troponin i, poc: 0.07 ng/mL (ref 0.00–0.08)

## 2018-10-24 MED ORDER — LEVOTHYROXINE SODIUM 112 MCG PO TABS
112.0000 ug | ORAL_TABLET | Freq: Every day | ORAL | Status: DC
  Administered 2018-10-25 – 2018-10-27 (×3): 112 ug via ORAL
  Filled 2018-10-24 (×3): qty 1

## 2018-10-24 MED ORDER — ENOXAPARIN SODIUM 40 MG/0.4ML ~~LOC~~ SOLN
40.0000 mg | SUBCUTANEOUS | Status: DC
  Administered 2018-10-24 – 2018-10-26 (×3): 40 mg via SUBCUTANEOUS
  Filled 2018-10-24 (×3): qty 0.4

## 2018-10-24 MED ORDER — CYANOCOBALAMIN 500 MCG PO TABS
500.0000 ug | ORAL_TABLET | Freq: Every day | ORAL | Status: DC
  Administered 2018-10-25 – 2018-10-27 (×3): 500 ug via ORAL
  Filled 2018-10-24 (×3): qty 1

## 2018-10-24 MED ORDER — SODIUM CHLORIDE 0.9% FLUSH
3.0000 mL | Freq: Two times a day (BID) | INTRAVENOUS | Status: DC
  Administered 2018-10-24 – 2018-10-27 (×6): 3 mL via INTRAVENOUS

## 2018-10-24 MED ORDER — FOLIC ACID 1 MG PO TABS
1.0000 mg | ORAL_TABLET | Freq: Every day | ORAL | Status: DC
  Administered 2018-10-25 – 2018-10-27 (×3): 1 mg via ORAL
  Filled 2018-10-24 (×3): qty 1

## 2018-10-24 MED ORDER — ACETAMINOPHEN 650 MG RE SUPP: 650.0000 mg | Freq: Four times a day (QID) | RECTAL | Status: DC | PRN

## 2018-10-24 MED ORDER — ATORVASTATIN CALCIUM 10 MG PO TABS
10.0000 mg | ORAL_TABLET | Freq: Every day | ORAL | Status: DC
  Administered 2018-10-24 – 2018-10-26 (×3): 10 mg via ORAL
  Filled 2018-10-24 (×3): qty 1

## 2018-10-24 MED ORDER — DONEPEZIL HCL 10 MG PO TABS
10.0000 mg | ORAL_TABLET | Freq: Every day | ORAL | Status: DC
  Administered 2018-10-24 – 2018-10-26 (×3): 10 mg via ORAL
  Filled 2018-10-24 (×3): qty 1

## 2018-10-24 MED ORDER — ADULT MULTIVITAMIN W/MINERALS CH
1.0000 | ORAL_TABLET | Freq: Every day | ORAL | Status: DC
  Administered 2018-10-25 – 2018-10-27 (×3): 1 via ORAL
  Filled 2018-10-24 (×3): qty 1

## 2018-10-24 MED ORDER — ASPIRIN 81 MG PO CHEW
81.0000 mg | CHEWABLE_TABLET | Freq: Every day | ORAL | Status: DC
  Administered 2018-10-25 – 2018-10-27 (×3): 81 mg via ORAL
  Filled 2018-10-24 (×3): qty 1

## 2018-10-24 MED ORDER — POLYETHYLENE GLYCOL 3350 17 G PO PACK: 17.0000 g | PACK | Freq: Every day | ORAL | Status: DC | PRN

## 2018-10-24 MED ORDER — MEMANTINE HCL 10 MG PO TABS
10.0000 mg | ORAL_TABLET | Freq: Two times a day (BID) | ORAL | Status: DC
  Administered 2018-10-24 – 2018-10-27 (×6): 10 mg via ORAL
  Filled 2018-10-24 (×7): qty 1

## 2018-10-24 MED ORDER — MOMETASONE FURO-FORMOTEROL FUM 200-5 MCG/ACT IN AERO
2.0000 | INHALATION_SPRAY | Freq: Two times a day (BID) | RESPIRATORY_TRACT | Status: DC
  Administered 2018-10-25 – 2018-10-26 (×3): 2 via RESPIRATORY_TRACT
  Filled 2018-10-24 (×2): qty 8.8

## 2018-10-24 MED ORDER — ACETAMINOPHEN 325 MG PO TABS: 650.0000 mg | ORAL_TABLET | Freq: Four times a day (QID) | ORAL | Status: DC | PRN

## 2018-10-24 MED ORDER — CALCIUM CARBONATE 1250 (500 CA) MG PO TABS
500.0000 mg | ORAL_TABLET | Freq: Every day | ORAL | Status: DC
  Administered 2018-10-25 – 2018-10-27 (×3): 500 mg via ORAL
  Filled 2018-10-24 (×3): qty 1

## 2018-10-24 NOTE — Progress Notes (Signed)
Attempted to receive report. 

## 2018-10-24 NOTE — ED Provider Notes (Signed)
[facility] Provider Note   CSN: [id number] Arrival date & time: 10/24/18  1229     History   Chief Complaint Chief Complaint  Patient presents with  . Loss of Consciousness    HPI Kelsey PeelingBeverly Norris is a 77 y.o. female with a past medical history of dementia, IBS who presents to ED for syncopal episode that occurred prior to arrival while at physical therapy.  Patient is status post right hip surgery for fracture on 10/09/2018.  She family member at bedside states that she has been doing well in physical therapy.  She was in some sort of hardness today and then lost consciousness.  She did not fall or hit the floor.  She was lowered to the ground by staff.  She then slowly regained consciousness without any outside efforts.  Family member states that this is happened to her in the past but "they run test but they never know why."  Per family, patient is back to her mental baseline.  She resides at [facility].  HPI  Past Medical History:  Diagnosis Date  . Arthritis   . Asthma   . Carpal tunnel syndrome   . Dementia (HCC)   . Elevated LFTs   . Helicobacter pylori gastritis   . Hiatal hernia   . Hyperlipidemia   . Hypothyroidism   . IBS (irritable bowel syndrome)   . Sleep apnea     Patient Active Problem List   Diagnosis Date Noted  . Symptomatic anemia 10/13/2018  . Fracture of femoral neck, right, closed (HCC) 10/10/2018  . Femoral fracture (HCC) 10/09/2018  . Syncope 03/29/2018  . Helicobacter pylori gastritis 02/25/2011  . WEIGHT LOSS 12/31/2010  . ABDOMINAL PAIN, UPPER 12/31/2010  . Dementia (HCC) 08/14/2009  . ASTHMA 08/14/2009  . CONSTIPATION 08/14/2009    Past Surgical History:  Procedure Laterality Date  . ABDOMINAL HYSTERECTOMY    . BREAST LUMPECTOMY     left  . CARPAL TUNNEL RELEASE  1994   bi-lateral  . ear lymphectomy    . HIP PINNING,CANNULATED Right 10/09/2018   Procedure: CANNULATED HIP PINNING;  Surgeon:  Sheral ApleyMurphy, Timothy D, MD;  Location: [facility] OR;  Service: Orthopedics;  Laterality: Right;  . 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 THYROID SURGERY    . TONSILLECTOMY    . WRIST GANGLION EXCISION     left     OB History   No obstetric history on file.      Home Medications    Prior to Admission medications   Medication Sig Start Date End Date Taking? Authorizing Provider  acetaminophen (TYLENOL) 500 MG tablet Take 500 mg by mouth every 8 (eight) hours as needed for mild pain.    [provider]  Ascorbic Acid (VITAMIN C) 1000 MG tablet Take 1,000 mg by mouth daily.      [provider]  aspirin 81 MG tablet Take 81 mg by mouth daily.     [provider]  atorvastatin (LIPITOR) 10 MG tablet Take 10 mg by mouth daily.    [provider]  budesonide-formoterol (SYMBICORT) 160-4.5 MCG/ACT inhaler Inhale 2 puffs into the lungs 2 (two) times daily.    [provider]  calcium carbonate (OSCAL) 1500 (600 Ca) MG TABS tablet Take 600 mg of elemental calcium by mouth daily with breakfast.    [provider]  cetirizine (ZYRTEC) 10 MG tablet Take 10 mg by mouth daily.    [provider]  donepezil (ARICEPT) 10 MG tablet Take 10 mg by mouth at bedtime.     [provider]  enoxaparin (LOVENOX) 40 MG/0.4ML injection Inject 0.4 mLs (40 mg total) into the skin daily for 30 doses. For 30 days post op for DVT prophylaxis 10/14/18 11/13/18  Theotis Barrio, MD  folic acid (FOLVITE) 1 MG tablet Take 1 mg by mouth daily.    [provider]  levothyroxine (LEVOXYL) 112 MCG tablet Take 112 mcg by mouth daily.     [provider]  memantine (NAMENDA) 10 MG tablet Take 10 mg by mouth 2 (two) times daily.     [provider]  montelukast (SINGULAIR) 10 MG tablet Take 10 mg by mouth daily.     [provider]  Multiple Vitamin (MULTIVITAMIN) tablet Take 1 tablet by mouth daily.     [provider]    polyethylene glycol (MIRALAX / GLYCOLAX) packet Take 17 g by mouth daily.      [provider]  vitamin B-12 (CYANOCOBALAMIN) 500 MCG tablet Take 500 mcg by mouth daily.     [provider]    Family History Family History  Problem Relation Age of Onset  . Diabetes Sister     Social History Social History   Tobacco Use  . Smoking status: Never Smoker  . Smokeless tobacco: Never Used  Substance Use Topics  . Alcohol use: No  . Drug use: No     Allergies   Patient has no known allergies.   Review of Systems Review of Systems  Unable to perform ROS: Dementia     Physical Exam Updated Vital Signs BP (!) 109/94   Pulse 70   Temp 98.3 F (36.8 C) (Axillary)   Resp (!) 22   SpO2 100%   Physical Exam Vitals signs and nursing note reviewed.  Constitutional:      General: She is not in acute distress.    Appearance: She is well-developed.     Comments: Pleasantly demented.  Smiling.  Nonverbal.  HENT:     Head: Normocephalic and atraumatic.     Nose: Nose normal.  Eyes:     General: No scleral icterus.       Right eye: No discharge.        Left eye: No discharge.     Conjunctiva/sclera: Conjunctivae normal.     Pupils: Pupils are equal, round, and reactive to light.  Neck:     Musculoskeletal: Normal range of motion and neck supple.  Cardiovascular:     Rate and Rhythm: Normal rate and regular rhythm.     Heart sounds: Normal heart sounds. No murmur. No friction rub. No gallop.   Pulmonary:     Effort: Pulmonary effort is normal. No respiratory distress.     Breath sounds: Normal breath sounds.  Abdominal:     General: Bowel sounds are normal. There is no distension.     Palpations: Abdomen is soft.     Tenderness: There is no abdominal tenderness. There is no guarding.  Musculoskeletal: Normal range of motion.     Comments: No midline spinal tenderness present in lumbar, thoracic or cervical spine. No step-off palpated. No visible  bruising, edema or temperature change noted.   Skin:    General: Skin is warm and dry.     Findings: No rash.     Comments: Skin breakdown noted near the sacrum.  No open wounds, drainage or bleeding noted.  Neurological:  Mental Status: She is alert.     Motor: No abnormal muscle tone.     Coordination: Coordination normal.     Comments: Pupils reactive. No facial asymmetry noted.       ED Treatments / Results  Labs (all labs ordered are listed, but only abnormal results are displayed) Labs Reviewed  COMPREHENSIVE METABOLIC PANEL - Abnormal; Notable for the following components:      Result Value   Glucose, Bld 100 (*)    Total Protein 6.1 (*)    Albumin 3.3 (*)    Total Bilirubin 2.0 (*)    All other components within normal limits  CBC WITH DIFFERENTIAL/PLATELET - Abnormal; Notable for the following components:   RBC 3.40 (*)    Hemoglobin 10.5 (*)    HCT 32.4 (*)    Neutro Abs 8.2 (*)    All other components within normal limits  URINE CULTURE  URINALYSIS, ROUTINE W REFLEX MICROSCOPIC  I-STAT TROPONIN, ED    EKG None  Radiology Dg Chest 1 View  Result Date: 10/24/2018 CLINICAL DATA:  Altered mental status. Syncope. EXAM: CHEST  1 VIEW COMPARISON:  10/09/2018 FINDINGS: The heart size and pulmonary vascularity are normal and the lungs are clear. Calcified lymph nodes in the right hilum. No effusions. No significant bone abnormality. IMPRESSION: No active disease. Electronically Signed   By: Francene BoyersJames  Maxwell M.D.   On: 10/24/2018 13:39   Ct Head Wo Contrast  Result Date: 10/24/2018 CLINICAL DATA:  Altered level of consciousness. EXAM: CT HEAD WITHOUT CONTRAST TECHNIQUE: Contiguous axial images were obtained from the base of the skull through the vertex without intravenous contrast. COMPARISON:  CT scan of May 28, 2018. FINDINGS: Brain: Moderate diffuse cortical atrophy is noted. Mild chronic ischemic white matter disease is noted. No mass effect or midline shift is  noted. Ventricular size is within normal limits. There is no evidence of mass lesion, hemorrhage or acute infarction. Vascular: No hyperdense vessel or unexpected calcification. Skull: Normal. Negative for fracture or focal lesion. Sinuses/Orbits: No acute finding. Other: Fluid is noted in mastoid air cells. IMPRESSION: Moderate diffuse cortical atrophy. Mild chronic ischemic white matter disease. No acute intracranial abnormality seen. Electronically Signed   By: Lupita RaiderJames  Green Jr, M.D.   On: 10/24/2018 13:20   Dg Hip Unilat With Pelvis 2-3 Views Right  Result Date: 10/24/2018 CLINICAL DATA:  Altered mental status. Possible fall. Patient status post fixation of a right subcapital fracture 10/09/2018. EXAM: DG HIP (WITH OR WITHOUT PELVIS) 2-3V RIGHT COMPARISON:  None. FINDINGS: 3 screws fixing a right subcapital fracture are intact and unchanged in position. There is no acute bony or joint abnormality. Lower lumbar spondylosis noted. IMPRESSION: No acute finding. Status post fixation of a right subcapital fracture without evidence of complication. Electronically Signed   By: Drusilla Kannerhomas  Dalessio M.D.   On: 10/24/2018 13:40    Procedures Procedures (including critical care time)  Medications Ordered in ED Medications - No data to display   Initial Impression / Assessment and Plan / ED Course  I have reviewed the triage vital signs and the nursing notes.  Pertinent labs & imaging results that were available during my care of the patient were reviewed by me and considered in my medical decision making (see chart for details).     77 year old female with a past medical history of advanced dementia, IBS presents to ED for syncopal episode that occurred prior to arrival while at physical therapy.  She is status post right hip  fracture surgery on 10/09/2018.  History is provided by family at bedside.  She was wearing a harness today while at physical therapy and then lost consciousness while standing up.   She was lowered to the ground, denies any falls or head injuries.  They state that this has happened to her in the past with no specific cause.  Per family, patient is back to her mental baseline.  On my exam she is pleasantly demented, alert but nonverbal and unwilling to follow commands.  No gross facial asymmetry noted.  Pupils are equal, round and reactive.  Chronic skin breakdown noted in the sacrum.  She is afebrile.  Other vital signs within normal limits.  Plan is to obtain imaging, lab work.  Imaging including chest x-ray, CT of the head, x-ray of the pelvis are unremarkable for acute findings.  Lab work significant for hemoglobin of 10.5 which is one-point higher than her prior reading earlier this week.  CMP is unremarkable.  Troponin is negative.  EKG shows sinus rhythm.  Urinalysis is unremarkable.  Patient will need to be admitted for syncope work-up as she has high risk due to her dementia and recent fracture.  Internal medicine teaching service to admit.   Portions of this note were generated with Scientist, clinical (histocompatibility and immunogenetics). Dictation errors may occur despite best attempts at proofreading.   Final Clinical Impressions(s) / ED Diagnoses   Final diagnoses:  Pain  Syncope    ED Discharge Orders    None       Dietrich Pates, PA-C 10/24/18 1544    Vanetta Mulders, MD 10/31/18 1143

## 2018-10-24 NOTE — ED Notes (Signed)
Pt is still in imaging at this time.

## 2018-10-24 NOTE — ED Notes (Signed)
Attempted report. left call back number with unit secretary.

## 2018-10-24 NOTE — H&P (Addendum)
Date: 10/24/2018               Patient Name:  Kelsey Norris MRN: [id number]  DOB: 1941/04/19 Age / Sex: 77 y.o., female   PCP: Tracey HarriesBouska, David, MD         Medical Service: Internal Medicine Teaching Service         Attending Physician: Dr. Burns SpainButcher, Elizabeth A, MD    First Contact: Dr. Julian HyJamie Kortlynn Poust Pager: [phone]  Second Contact: Dr. Randa Lynnaly Santos Pager: [phone]       After Hours (After 5p/  First Contact Pager: [phone]  weekends / holidays): Second Contact Pager: [phone]   Chief Complaint: Syncope  History of Present Illness: [name] is a 77 year old female with advanced dementia, hyperlipidemia, hypothyroidism, and asthma who presents after an episode of syncope.  [name] is unable to answer any questions due to her advanced dementia.  History is given by both her husband and daughter.  She recently underwent a right hip surgery for fracture on 10-09-2017.  She was discharged to a rehab center on December 20.  She has been doing well since then until this morning when she was working with physical therapy.  She was standing in a PT harness when she abruptly lost consciousness.  She came to within 5 minutes.  Per her family she did not complain of chest pain, shortness of breath, or any other prodromal symptoms prior to syncopizing seen.  They believe that she is currently back to her mental baseline.  She has been seen in the ED/hospitalized 3 times in the past year for syncope.  Most recent admission included 03/2018 where she was thought to have had orthostatic syncope and treated with IV fluids.  Echo performed at this time showed a normal EF of 65 to 70% with moderate LVH, severe basal septal hypertrophy without left ventricular outlet obstruction, grade 1 diastolic dysfunction, mild MR/TR.  In August 2018 during another admission for syncope she had a normal EEG and carotid artery duplex with only mild atherosclerosis of the carotid arteries as well as mild internal carotid  artery stenosis (1-39% bilaterally).   Meds:  Current Meds  Medication Sig  . acetaminophen (TYLENOL) 500 MG tablet Take 500 mg by mouth every 8 (eight) hours as needed for mild pain.  . Ascorbic Acid (VITAMIN C) 1000 MG tablet Take 1,000 mg by mouth daily.    Marland Kitchen. aspirin 81 MG chewable tablet Chew 81 mg by mouth daily.  Marland Kitchen. atorvastatin (LIPITOR) 10 MG tablet Take 10 mg by mouth at bedtime.   . budesonide-formoterol (SYMBICORT) 160-4.5 MCG/ACT inhaler Inhale 2 puffs into the lungs 2 (two) times daily.  . calcium carbonate (OSCAL) 1500 (600 Ca) MG TABS tablet Take 600 mg of elemental calcium by mouth daily with breakfast.  . cetirizine (ZYRTEC) 10 MG tablet Take 10 mg by mouth daily.  Marland Kitchen. donepezil (ARICEPT) 10 MG tablet Take 10 mg by mouth at bedtime.   . enoxaparin (LOVENOX) 40 MG/0.4ML injection Inject 0.4 mLs (40 mg total) into the skin daily for 30 doses. For 30 days post op for DVT prophylaxis (Patient taking differently: Inject 40 mg into the skin at bedtime. For 30 days post op for DVT prophylaxis)  . folic acid (FOLVITE) 1 MG tablet Take 1 mg by mouth daily.  Marland Kitchen. levothyroxine (LEVOXYL) 112 MCG tablet Take 112 mcg by mouth daily before breakfast.   . memantine (NAMENDA) 10 MG tablet Take 10 mg by mouth 2 (two) times  daily.   . montelukast (SINGULAIR) 10 MG tablet Take 10 mg by mouth daily.   . Multiple Vitamin (MULTIVITAMIN WITH MINERALS) TABS tablet Take 1 tablet by mouth daily.  . polyethylene glycol (MIRALAX / GLYCOLAX) packet Take 17 g by mouth daily. Mix with 8 oz water and drink daily for bowel aid.  Marland Kitchen. senna-docusate (SENNA S) 8.6-50 MG tablet Take 2 tablets by mouth daily as needed (constipation).  . vitamin B-12 (CYANOCOBALAMIN) 500 MCG tablet Take 500 mcg by mouth daily.      Allergies: Allergies as of 10/24/2018  . (No Known Allergies)   Past Medical History:  Diagnosis Date  . Arthritis   . Asthma   . Carpal tunnel syndrome   . Dementia (HCC)   . Elevated LFTs   .  Helicobacter pylori gastritis   . Hiatal hernia   . Hyperlipidemia   . Hypothyroidism   . IBS (irritable bowel syndrome)   . Sleep apnea     Family History:  Family History  Problem Relation Age of Onset  . Diabetes Sister     Social History: Per her family she does not use tobacco or alcohol products.  They do not believe that she is ever used drugs.  Review of Systems: A complete ROS was negative except as per HPI.  Physical Exam: Blood pressure 110/68, pulse 73, temperature 98.3 F (36.8 C), temperature source Axillary, resp. rate 19, SpO2 100 %. Physical Exam Constitutional:      Comments: Frail-appearing elderly female lying in bed in no acute distress.  She is chewing on her left hand.  She does not respond to questioning.  Cardiovascular:     Rate and Rhythm: Normal rate and regular rhythm.  Pulmonary:     Comments: Lungs auscultated anteriorly.  Clear to auscultation bilaterally. Abdominal:     General: There is no distension.     Palpations: Abdomen is soft.     Tenderness: There is no abdominal tenderness.  Musculoskeletal:        General: No tenderness.     Right lower leg: No edema.     Left lower leg: No edema.  Skin:    General: Skin is warm and dry.  Neurological:     Comments: She is alert throughout my exam.  She does not answer any questions.  It is very difficult to do a neurologic exam due to her being uncooperative.  She does track objects throughout the room and has normal extraocular movements.    Psychiatric:        Mood and Affect: Mood normal.        Behavior: Behavior normal.    Labs: CBC- Normocytic anemia Hb 10.5 BMP- Unremarkable istat troponin- 0.07 Urinalysis-unremarkable  EKG: personally reviewed my interpretation is normal sinus rhythm  CXR: personally reviewed my interpretation is normal heart size, no signs of infiltrate or pulmonary edema, no bony fractures  Head CT: IMPRESSION: Moderate diffuse cortical atrophy. Mild  chronic ischemic white matter disease. No acute intracranial abnormality seen.  Hip and Pelvis X-ray: IMPRESSION: No acute finding. Status post fixation of a right subcapital fracture without evidence of complication.   Assessment & Plan by Problem: Active Problems:   Syncope  Ms. Edelstein is a 77 year old female with advanced dementia, hypothyroidism, OSA, recurrent episodes of syncope who presents after an episode of syncope. She is now back to her baseline. Differential includes vasovagal syncope, orthostatic syncope, and cardiac arrhythmia. She has had a low blood pressure (SBP 106-115) which  could have contributed to her syncopal episode. Unfortunately, we cannot obtain orthostatics because of her limited mobility. We will continue to monitor her blood pressure carefully. Additionally, will start continuous cardiac monitoring to look for an underlying cardiac arrhythmia.  Syncope - Continuous cardiac monitoring - PT/OT Eval  OSA: Has not been using her CPAP while in the rehab center. - Restart CPAP  Advanced dementia: Current home regiment includes donepezil and memantine. She is currently at her baseline. - continue donepezil 10 mg QD - continue memantine 10 mg QD  Hypothyroidism: - continue home levothyroxine 112 mcg QD  Dispo: Admit patient to Observation with expected length of stay less than 2 midnights.  Signed: Synetta Shadow, MD 10/24/2018, 3:38 PM  Pager: [phone]

## 2018-10-24 NOTE — ED Triage Notes (Signed)
[facility]hab of R hip surgery on Dec 15.  She was placed in harness to stand for rehab today and lost consciousness.  She was lowered to chair and placed back in bed in room.  When [facility] arrived pt was alert to pain.  CBG 120, vs 100/50, hr 80, 92% RA.  Hx of dementia.  Per family, pt was alert per norm when being placed into ambulance.

## 2018-10-25 DIAGNOSIS — R55 Syncope and collapse: Secondary | ICD-10-CM

## 2018-10-25 DIAGNOSIS — L8990 Pressure ulcer of unspecified site, unspecified stage: Secondary | ICD-10-CM

## 2018-10-25 DIAGNOSIS — R52 Pain, unspecified: Secondary | ICD-10-CM

## 2018-10-25 LAB — BASIC METABOLIC PANEL
Anion gap: 9 (ref 5–15)
BUN: 18 mg/dL (ref 8–23)
CALCIUM: 9.6 mg/dL (ref 8.9–10.3)
CO2: 24 mmol/L (ref 22–32)
Chloride: 109 mmol/L (ref 98–111)
Creatinine, Ser: 0.81 mg/dL (ref 0.44–1.00)
GFR calc Af Amer: >60 mL/min (ref >60)
GFR calc non Af Amer: >60 mL/min (ref >60)
Glucose, Bld: 97 mg/dL (ref 70–99)
Potassium: 4 mmol/L (ref 3.5–5.1)
Sodium: 142 mmol/L (ref 135–145)

## 2018-10-25 LAB — URINE CULTURE: CULTURE: NO GROWTH

## 2018-10-25 NOTE — Discharge Summary (Addendum)
Name: Kelsey PeelingBeverly Norris MRN: [id number] DOB: [birth year] 77 y.o. PCP: Tracey HarriesBouska, David, MD  Date of Admission: 10/24/2018 12:29 PM Date of Discharge: 10/27/2017 Attending Physician: Burns SpainButcher, Elizabeth A, MD  Discharge Diagnosis: 1. Orthostatic syncope 2. Obstructive sleep apnea 3. Advanced dementia 4. Hypothyroidism 5. Normocytic anemia  Discharge Medications: Allergies as of 10/25/2018   No Known Allergies     Medication List    TAKE these medications   acetaminophen 500 MG tablet Commonly known as:  TYLENOL Take 500 mg by mouth every 8 (eight) hours as needed for mild pain.   aspirin 81 MG chewable tablet Chew 81 mg by mouth daily.   atorvastatin 10 MG tablet Commonly known as:  LIPITOR Take 10 mg by mouth at bedtime.   budesonide-formoterol 160-4.5 MCG/ACT inhaler Commonly known as:  SYMBICORT Inhale 2 puffs into the lungs 2 (two) times daily.   calcium carbonate 1500 (600 Ca) MG Tabs tablet Commonly known as:  OSCAL Take 600 mg of elemental calcium by mouth daily with breakfast.   cetirizine 10 MG tablet Commonly known as:  ZYRTEC Take 10 mg by mouth daily.   donepezil 10 MG tablet Commonly known as:  ARICEPT Take 10 mg by mouth at bedtime.   enoxaparin 40 MG/0.4ML injection Commonly known as:  LOVENOX Inject 0.4 mLs (40 mg total) into the skin daily for 30 doses. For 30 days post op for DVT prophylaxis What changed:  when to take this   folic acid 1 MG tablet Commonly known as:  FOLVITE Take 1 mg by mouth daily.   LEVOXYL 112 MCG tablet Generic drug:  levothyroxine Take 112 mcg by mouth daily before breakfast.   memantine 10 MG tablet Commonly known as:  NAMENDA Take 10 mg by mouth 2 (two) times daily.   montelukast 10 MG tablet Commonly known as:  SINGULAIR Take 10 mg by mouth daily.   multivitamin with minerals Tabs tablet Take 1 tablet by mouth daily.   polyethylene glycol packet Commonly known as:  MIRALAX / GLYCOLAX Take 17 g by mouth  daily. Mix with 8 oz water and drink daily for bowel aid.   SENNA S 8.6-50 MG tablet Generic drug:  senna-docusate Take 2 tablets by mouth daily as needed (constipation).   vitamin B-12 500 MCG tablet Commonly known as:  CYANOCOBALAMIN Take 500 mcg by mouth daily.   vitamin C 1000 MG tablet Take 1,000 mg by mouth daily.       Disposition and follow-up:   Ms.Kelsey Norris was [facility] in St[facility].  At the hospital follow up visit please address:  1.  Please monitor [name]'s blood pressure.  Please continue physical therapy at SNF.  2.  Labs / imaging needed at time of follow-up: None  3.  Pending labs/ test needing follow-up: None  Hospital Course by problem list: 1. Orthostatic syncope: [name] presented after an episode of syncope during physical therapy yesterday.  She was found to have a low blood pressure which may have contributed to her syncopal episode.  Her labs were unremarkable.  EKG showed normal sinus rhythm.  Chest x-ray showed no signs of infiltrate or pulmonary edema and no bony abnormalities.  Head CT as well as hip and pelvic x-ray were unremarkable.  She was observed overnight.  Continuous cardiac monitoring did not show signs of an acute arrhythmia.  She did not have any other episodes of syncope.  She was evaluated by physical therapy who recommended returning  to SNF for continued rehabilitation.  She was back to her baseline prior to discharge.  2.  Obstructive sleep apnea: She was using a CPAP machine prior to her recent stay at the rehabilitation center.  The CPAP was restarted during her admission.  She did not have any shortness of breath and was saturating well on room air. She will need to continue CPAP at discharge.   3.  Advanced dementia: Her mental status is back to baseline prior to discharge.  Her home memantine and donezepil were continued during admission.  These medications will need to continue at  discharge.  4.  Hypothyroidism: We continued her home levothyroxine during admission.  This medication will need to continue at discharge.  5.  Normocytic anemia: She was found to have a hemoglobin of 10.5 during admission.  This is actually higher than her previous CBCs 2 weeks ago.  Please monitor for signs and symptoms of anemia.  Discharge Vitals:   BP 116/61 (BP Location: Left Arm)   Pulse 62   Temp 98.5 F (36.9 C) (Oral)   Resp 20   Ht 5\' 2"  (1.575 m)   Wt 60.1 kg   SpO2 100%   BMI 24.23 kg/m   Pertinent Labs, Studies, and Procedures:   CBC Latest Ref Rng & Units 10/24/2018 10/14/2018 10/13/2018  WBC 4.0 - 10.5 K/uL 10.1 10.3 -  Hemoglobin 12.0 - 15.0 g/dL 10.5(L) 9.6(L) 8.8(L)  Hematocrit 36.0 - 46.0 % 32.4(L) 30.0(L) 26.9(L)  Platelets 150 - 400 K/uL 258 135(L) -   Head CT: IMPRESSION: Moderate diffuse cortical atrophy. Mild chronic ischemic white matter disease. No acute intracranial abnormality seen.  Hip and Pelvis X-ray: IMPRESSION: No acute finding. Status post fixation of a right subcapital fracture without evidence of complication.   Discharge Instructions: Discharge Instructions    Diet - low sodium heart healthy   Complete by:  As directed    Discharge instructions   Complete by:  As directed    Please see discharge summary.   Increase activity slowly   Complete by:  As directed       Signed: Synetta ShadowPrince, Jamie M, MD 10/25/2018, 11:59 AM   Pager: [phone]

## 2018-10-25 NOTE — Progress Notes (Signed)
   Subjective: [name] was seen and evaluated at bedside. No acute events overnight. Family is at bedside and states she is more alert. She had some water and apple juice this morning. Discussed her labs and work-up have been unremarkable and that this episode most likely occurred due to low blood pressure. All questions and concerns were addressed.   Objective:  Vital signs in last 24 hours: Vitals:   10/24/18 1836 10/24/18 2124 10/25/18 0418 10/25/18 0500  BP: 120/68 (!) 138/58 116/61   Pulse: 74 69 62   Resp: (!) 26 20 20    Temp: 98.6 F (37 C) 98.4 F (36.9 C) 98.5 F (36.9 C)   TempSrc: Oral Oral Oral   SpO2: 100% 100% 100%   Weight:    60.1 kg  Height:    5\' 2"  (1.575 m)   Physical exam: General: awake, alert, frail-appearing elderly female sitting up in her chair in NAD CV: RRR Pulm: Auscultated anteriorly, clear to auscultation bilaterally.  Assessment/Plan:  Active Problems:   Dementia (HCC)   Syncope   Obstructive sleep apnea   Pressure injury of skin  [name] was admitted after an episode of syncope likely due to vasovagal syncope versus orthostatic syncope.  Due to her limited mobility we cannot get orthostatics.  Her blood pressure has however improved since admission.  Cardiac monitoring overnight did not show any signs of a cardiac arrhythmia.  She is back to her mental baseline per family.  Physical therapy evaluated her and recommended returning to SNF.  She will be discharged today back to the rehab facility today for continued physical therapy.  Dispo: Anticipated discharge today.  Kelsey Norris, Kelsey Norris M, MD 10/25/2018, 10:17 AM Pager: [phone]

## 2018-10-25 NOTE — Evaluation (Signed)
Physical Therapy Evaluation Patient Details Name: Kelsey Norris MRN: [id number] DOB: 1941-08-12 Today's Date: 10/25/2018   History of Present Illness  77 y.o. female admitted after experiencing a syncopal episode during PT at SNF. Hx of R cannulated hip pinning on 12/15. PMHx includes dementia, Arthritis, Asthma, Carpal tunnel syndrome, Hiatal hernia, and Sleep apnea.  Clinical Impression  On eval, pt requires Total assist for mobility. Attempted sit to stand from recliner, but unable with +1 assist. Worked on anterior weight shifting in preparation for standing. Also worked on LE and UE exercises x 10 reps each. Husband present during session. Discussed d/c plan-plan is for pt to return to SNF for continued rehab.     Follow Up Recommendations SNF    Equipment Recommendations  None recommended by PT    Recommendations for Other Services       Precautions / Restrictions Precautions Precautions: Fall Required Braces or Orthoses: Other Brace Other Brace: per spouse, pt wears R knee brace at baseline (in room) Restrictions Weight Bearing Restrictions: No RLE Weight Bearing: Weight bearing as tolerated LLE Weight Bearing: Weight bearing as tolerated      Mobility  Bed Mobility Overal bed mobility: Needs Assistance Bed Mobility: Supine to Sit;Sit to Supine;Rolling Rolling: Total assist;+2 for physical assistance     General bed mobility comments: NT-pt up in recliner. +2 assist required during OT session.   Transfers Overall transfer level: Needs assistance Equipment used: 2 person hand held assist Transfers: Sit to/from Stand Sit to Stand: Total assist;+2 physical assistance;+2 safety/equipment         General transfer comment: Attempted x 3. Unable to stand from recliner with 1 person assist. Worked on anterior weight shifting while in recliner to encourage flexion of trunk/hip in preparation for standing.  Ambulation/Gait                Stairs             Wheelchair Mobility    Modified Rankin (Stroke Patients Only)       Balance Overall balance assessment: Needs assistance Sitting-balance support: Feet supported Sitting balance-Leahy Scale: Poor Sitting balance - Comments: initially requires maxA for sitting balance with posterior lean, progressed to close minguard with increased time, though when fatigued requires increased assist Postural control: Posterior lean Standing balance support: Bilateral upper extremity supported Standing balance-Leahy Scale: Zero Standing balance comment: requires max-totalA+2                             Pertinent Vitals/Pain Pain Assessment: Faces Faces Pain Scale: No hurt Pain Location: R hip Pain Descriptors / Indicators: Guarding;Grimacing    Home Living Family/patient expects to be discharged to:: Skilled nursing facility Living Arrangements: Spouse/significant other Available Help at Discharge: Friend(s);Available 24 hours/day           Home Equipment: Bedside commode;Wheelchair - manual;Hospital bed;Other (comment) Additional Comments: Return top rehab    Prior Function Level of Independence: Needs assistance   Gait / Transfers Assistance Needed: working on standing and ambulation with PT at SNF  ADL's / Homemaking Assistance Needed: spouse reports pt overall max-totalA for self-care        Hand Dominance        Extremity/Trunk Assessment   Upper Extremity Assessment Upper Extremity Assessment: Difficult to assess due to impaired cognition    Lower Extremity Assessment Lower Extremity Assessment: Difficult to assess due to impaired cognition RLE Deficits / Details: Strength at least 2/5  throughout. Pt resistant to ROM at times.  RLE: Unable to fully assess due to pain RLE Coordination: decreased gross motor;decreased fine motor       Communication   Communication: Expressive difficulties(mostly mumbles; unintelligible)  Cognition Arousal/Alertness:  Awake/alert Behavior During Therapy: Flat affect Overall Cognitive Status: History of cognitive impairments - at baseline                                 General Comments: pt with baseline dementia; alert during session but does not follow one step commands       General Comments      Exercises General Exercises - Upper Extremity Shoulder Flexion: AAROM;Both;10 reps;Seated Elbow Flexion: AAROM;Both;10 reps;Seated General Exercises - Lower Extremity Ankle Circles/Pumps: AAROM;Both;10 reps;Seated Long Arc Quad: AAROM;Both;10 reps;Seated Heel Slides: AAROM;Both;10 reps;Seated Hip ABduction/ADduction: AAROM;Both;10 reps;Seated   Assessment/Plan    PT Assessment Patient needs continued PT services  PT Problem List Decreased strength;Decreased balance;Decreased range of motion;Decreased mobility;Decreased activity tolerance;Decreased cognition;Decreased knowledge of use of DME;Decreased safety awareness;Decreased knowledge of precautions;Pain       PT Treatment Interventions DME instruction;Gait training;Functional mobility training;Therapeutic activities;Balance training;Patient/family education;Therapeutic exercise    PT Goals (Current goals can be found in the Care Plan section)  Acute Rehab PT Goals Patient Stated Goal: pt unable to state. husband present-plan is for return to rehab PT Goal Formulation: With family Time For Goal Achievement: 11/08/18 Potential to Achieve Goals: Fair    Frequency Min 2X/week   Barriers to discharge        Co-evaluation               AM-PAC PT "6 Clicks" Mobility  Outcome Measure Help needed turning from your back to your side while in a flat bed without using bedrails?: Total Help needed moving from lying on your back to sitting on the side of a flat bed without using bedrails?: Total Help needed moving to and from a bed to a chair (including a wheelchair)?: Total Help needed standing up from a chair using your arms  (e.g., wheelchair or bedside chair)?: Total Help needed to walk in hospital room?: Total Help needed climbing 3-5 steps with a railing? : Total 6 Click Score: 6    End of Session   Activity Tolerance: Patient tolerated treatment well Patient left: in chair;with call bell/phone within reach;with family/visitor present   PT Visit Diagnosis: Muscle weakness (generalized) (M62.81);Difficulty in walking, not elsewhere classified (R26.2);Other abnormalities of gait and mobility (R26.89);History of falling (Z91.81);Pain    Time: [id number] PT Time Calculation (min) (ACUTE ONLY): 18 min   Charges:   PT Evaluation $PT Eval Moderate Complexity: 1 Mod            Rebeca AlertJannie Avya Flavell, PT Acute Rehabilitation Services Pager: [phone] Office: [phone]

## 2018-10-25 NOTE — Care Management Note (Signed)
Case Management Note Hortencia ConradiWendi Crystelle Ferrufino, RN MSN CCM Transitions of Care [facility] [place] [phone]  Patient Details  Name: Kelsey Norris MRN: [id number] Date of Birth: [birth year]  Subjective/Objective:            syncope        Action/Plan: [facility] following hip fracture. Husband at bedside stating patient being discharged to snf. No transition of care needs identified.   Expected Discharge Date:  10/25/18               Expected Discharge Plan:  Skilled Nursing Facility  In-House Referral:  Clinical Social Work  Discharge planning Services  CM Consult  Post Acute Care Choice:  NA Choice offered to:  NA  DME Arranged:  N/A DME Agency:  NA  HH Arranged:  NA HH Agency:  NA  Status of Service:  Completed, signed off  If discussed at [employer] Length of Stay Meetings, dates discussed:    Additional Comments:  Bess KindsWendi B Monserrate Blaschke, RN 10/25/2018, 1:48 PM

## 2018-10-25 NOTE — Progress Notes (Signed)
  Date: 10/25/2018  Patient name: Kelsey Norris  Medical record number: [id number]  Date of birth: Mar 04, 1941   I have seen and evaluated Kelsey Norris and discussed their care with the Residency Team. Kelsey Norris is a 77 yo female with advanced dementia and is a resident of a SNF since her R hip pinning after a fall resulted in a hip fracture on October 06, 2018.  Due to her dementia, she is unable to participate in the interview.  Details were obtained from the admitting team and her family member who was at bedside today.  She was working with PT and was standing in a harness when she abruptly lost consciousness.  She was lowered to the floor by the staff.  She regained consciousness within 5 minutes.  This is her fourth episode of syncope requiring admission or an ED evaluation in the past calendar year.  Work-up has included an echo in June of this year that showed an EF of 65%, moderate LVH, severe basal septal hypertrophy without LVOT obstruction, grade 1 diastolic dysfunction, mild MR, mild TR.  At a prior to discharge, her blood pressure medications were cut in half and then subsequently stopped.  In 2018, she had a normal EEG and a carotid artery duplex with mild atherosclerosis.  Of note, she has been on Lovenox for DVT prophylaxis since her orthopedic surgery.  This morning, she is lying in the recliner.  Her family member at bedside states that she is more alert and back to baseline.  She was able to drink water and apple juice this morning.  They are pleased with the decision to discharge her back to her SNF.  Vitals:   10/24/18 2124 10/25/18 0418  BP: (!) 138/58 116/61  Pulse: 69 62  Resp: 20 20  Temp: 98.4 F (36.9 C) 98.5 F (36.9 C)  SpO2:  100%  O2 sat 93 - 100% Gen lying in recliner, NAD, no identifiable words, smiles HRRR systolic murmur L clear anteriorly  Albumin 3.3 T bili 2.0 Troponin 0 0.07 HgB 10.5 (above baseline) UA no abnl  I personally viewed the CXR images and  confirmed my reading with the official read.  Poor quality because AP and supine.  No overt abnormalities  I personally viewed the EKG and confirmed my reading with the official read.  Sinus, normal axis, no atrial enlargement, T wave inversions in the inferior lateral leads  CT head moderate diffuse cortical atrophy, chronic ischemic white matter disease, no acute changes  Assessment and Plan: I have seen and evaluated the patient as outlined above. I agree with the formulated Assessment and Plan as detailed in the residents' note, with the following changes: Kelsey Norris is a 77 yo female with advanced dementia and is a resident of a SNF since her R hip pinning after a fall resulted in a hip fracture on October 06, 2018.  She was admitted following a observed syncopal episode without injury.  This is her fourth syncopal episode this calendar year requiring medical intervention.  She has had basic work-up in the past.  No specific etiology has been identified this admission.  She is stable to be discharged back to her SNF to continue rehabilitation.  Burns SpainButcher, Elizabeth A, MD 12/31/201912:19 PM

## 2018-10-25 NOTE — Social Work (Addendum)
1:34pm- Pt clinicals sent, will await determination.  12:28pm- Pt requires reauthorization to return to SNF. Will submit clinicals for pt insurance approval.  Octavio GravesIsabel Christia Coaxum, MSW, [facility] [place] Clinical Social Work [phone]

## 2018-10-25 NOTE — Social Work (Signed)
Continue to await SNF authorization.  CSW has spoken with pt husabnd, he is aware. Was worried since he had not ordered dinner tray so CSW call bedside RN and requested tray of soft food for pt for dinner.   CSW continuing to follow for support with disposition when insurance authorization received.   Octavio GravesIsabel Tyrrell Norris, MSW, [facility] [place] Clinical Social Work [phone]

## 2018-10-25 NOTE — Care Management Obs Status (Signed)
MEDICARE OBSERVATION STATUS NOTIFICATION   Patient Details  Name: Candida PeelingBeverly Castelluccio MRN: [id number] Date of Birth: 01-03-1941   Medicare Observation Status Notification Given:  Yes    Bess KindsWendi B Mekhia Brogan, RN 10/25/2018, 1:45 PM

## 2018-10-25 NOTE — Clinical Social Work Note (Signed)
Clinical Social Work Assessment  Patient Details  Name: Kelsey PeelingBeverly Dattilio MRN: [id number] Date of Birth: 22-Oct-1941  Date of referral:  10/25/18               Reason for consult:  Facility Placement, Discharge Planning                Permission sought to share information with:  Family Supports, Magazine features editoracility Contact Representative Permission granted to share information::  No(pt disoriented)  Name::     Dorothe PeaJoseph Palladino  Agency::  [facility]  Relationship::  husband  Contact Information:  [phone]  Housing/Transportation Living arrangements for the past 2 months:  Skilled Nursing Facility, Single Family Home Source of Information:  Spouse Patient Interpreter Needed:  None Criminal Activity/Legal Involvement Pertinent to Current Situation/Hospitalization:  No - Comment as needed Significant Relationships:  Adult Children, Spouse, Other Family Members Lives with:  Spouse Do you feel safe going back to the place where you live?  Yes Need for family participation in patient care:  Yes (Comment)  Care giving concerns:  Pt from SNF where she has been post orthopaedic surgery. Pt husband takes care of pt, the plan is for return home post rehab.   Social Worker assessment / plan:  Pt spoke with pt husband. Reintroduced self, role. Clarified with pt husband that pt is still okay to [facility] but that we are having to get new authorization from pt insurance company. He understands but is antsy to return to rehab for pt to continue care. He understands that we await new authorization and when that is received she will be able to readmit.  Employment status:  Retired Database administratornsurance information:  Managed Medicare PT Recommendations:  24 Hour Supervision, Skilled Nursing Facility Information / Referral to community resources:  Skilled Nursing Facility  Patient/Family's Response to care:  Pt husband amenable to speaking with CSW, he desires for her to [facility].  Patient/Family's Understanding of and Emotional Response to Diagnosis, Current Treatment, and Prognosis:  Pt husband states understanding of diagnosis, current treatment and prognosis. He is an attentive and pleasant caregiver to the pt.   Emotional Assessment Appearance:  Appears stated age Attitude/Demeanor/Rapport:  Unable to Assess Affect (typically observed):  Unable to Assess Orientation:  Fluctuating Orientation (Suspected and/or reported Sundowners) Alcohol / Substance use:  Not Applicable Psych involvement (Current and /or in the community):  No (Comment)  Discharge Needs  Concerns to be addressed:  Care Coordination Readmission within the last 30 days:  Yes Current discharge risk:  Cognitively Impaired, Dependent with Mobility, Physical Impairment Barriers to Discharge:  Insurance Authorization   Doy Hutchingsabel H Sherald Balbuena, LCSWA 10/25/2018, 4:06 PM

## 2018-10-25 NOTE — Evaluation (Signed)
Occupational Therapy Evaluation Patient Details Name: Kelsey Norris MRN: [id number] DOB: Apr 06, 1941 Today's Date: 10/25/2018    History of Present Illness 77 y.o. female admitted after experiencing a syncopal episode during PT at SNF. Hx of R cannulated hip pinning on 12/15. PMHx includes dementia, Arthritis, Asthma, Carpal tunnel syndrome, Hiatal hernia, and Sleep apnea.   Clinical Impression   PTA, pt was at SNF for rehab after recent fall. Prior to rehab, she was at home with her husband who provided 24/7 care for ADLs and transfers. Pt currently requiring Total A +2 for ADLs, bed mobility, and functional transfers. Pt's husband very eager for pt to return to SNF to continue rehab. Recommend dc to SNF for further OT to optimize safety, increase occupational performance and participation, and decrease caregiver burden. All further OT needs may be met at rehab and pt planning for dc later today.   BPs during session: Supine 113/47 Sitting at EOB 105/69 Sitting in recliner after transfer 101/77 In recliner with BLE elevated 71/53, 97/53, and 102/59    Follow Up Recommendations  SNF    Equipment Recommendations  Other (comment)(TBD in next venue)    Recommendations for Other Services PT consult     Precautions / Restrictions Precautions Precautions: Fall Precaution Comments: WBAT from prior fall Required Braces or Orthoses: Other Brace Other Brace: per spouse, pt wears R knee brace at baseline (in room) Restrictions Weight Bearing Restrictions: No RLE Weight Bearing: Weight bearing as tolerated LLE Weight Bearing: Weight bearing as tolerated      Mobility Bed Mobility Overal bed mobility: Needs Assistance Bed Mobility: Supine to Sit;Rolling Rolling: Total assist;+2 for physical assistance   Supine to sit: Total assist;+2 for physical assistance;+2 for safety/equipment Sit to supine: Total assist;+2 for physical assistance;+2 for safety/equipment   General bed mobility  comments: Total A for bringing BLEs towards EOB and then elevating trunk. Use of bed pad for managing hips and bringing towards EOB. Bilateral knees blockes due to pt with tendency for extension at hips  Transfers Overall transfer level: Needs assistance Equipment used: 2 person hand held assist Transfers: Sit to/from [facility] Sit to Stand: Total assist;+2 physical assistance;+2 safety/equipment Stand pivot transfers: Total assist;+2 physical assistance;+2 safety/equipment       General transfer comment: Pt requiring Total A +2 to power up into standing with bilateral knees blocked for stability. Decreased strength and balance. Once in standing pt initating upright posture. Requiring Total A to bring hips towards reecliner for stand pivot    Balance Overall balance assessment: Needs assistance Sitting-balance support: Feet supported Sitting balance-Leahy Scale: Poor Sitting balance - Comments: tendency for extension at EOB Postural control: Posterior lean Standing balance support: Bilateral upper extremity supported;During functional activity Standing balance-Leahy Scale: Zero Standing balance comment: requires max-totalA+2                           ADL either performed or assessed with clinical judgement   ADL Overall ADL's : Needs assistance/impaired                                     Functional mobility during ADLs: Maximal assistance;Total assistance(supine to sitting EOB) General ADL Comments: Pt requiring Total A for ADLs, bed mobility, and functional transfers. Pt with bowel and urine incontience during session and required Total A for toilet hygiene.     Vision  Perception     Praxis      Pertinent Vitals/Pain Pain Assessment: Faces Faces Pain Scale: No hurt Pain Location: R hip Pain Descriptors / Indicators: Guarding;Grimacing Pain Intervention(s): Monitored during session;Limited activity within patient's  tolerance;Repositioned     Hand Dominance     Extremity/Trunk Assessment Upper Extremity Assessment Upper Extremity Assessment: Difficult to assess due to impaired cognition   Lower Extremity Assessment Lower Extremity Assessment: Defer to PT evaluation RLE Deficits / Details: Strength at least 2/5 throughout. Pt resistant to ROM at times.  RLE: Unable to fully assess due to pain RLE Coordination: decreased gross motor;decreased fine motor       Communication Communication Communication: Expressive difficulties(mostly mumbles; unintelligible)   Cognition Arousal/Alertness: Awake/alert Behavior During Therapy: Flat affect Overall Cognitive Status: History of cognitive impairments - at baseline                                 General Comments: pt with baseline dementia; alert during session but does not follow one step commands    General Comments  Husband present for begining of session and very motivated for pt to return to rehab    Exercises    Shoulder Instructions      Home Living Family/patient expects to be discharged to:: Skilled nursing facility Living Arrangements: Spouse/significant other Available Help at Discharge: Available 24 hours/day;Family;Friend(s)                         Home Equipment: Bedside commode;Wheelchair - manual;Hospital bed;Other (comment)   Additional Comments: Return to rehab      Prior Functioning/Environment Level of Independence: Needs assistance  Gait / Transfers Assistance Needed: working on standing and ambulation with PT at SNF ADL's / Homemaking Assistance Needed: spouse reports pt overall max-totalA for self-care including self feeding            OT Problem List: Decreased strength;Decreased range of motion;Decreased activity tolerance;Impaired balance (sitting and/or standing);Decreased cognition;Decreased safety awareness;Decreased knowledge of precautions;Pain      OT Treatment/Interventions:  Self-care/ADL training;Therapeutic exercise;Therapeutic activities;Patient/family education;Balance training;DME and/or AE instruction    OT Goals(Current goals can be found in the care plan section) Acute Rehab OT Goals Patient Stated Goal: pt unable to state. husband present-plan is for return to rehab OT Goal Formulation: With patient/family Time For Goal Achievement: 11/08/18 Potential to Achieve Goals: Good  OT Frequency: Min 2X/week   Barriers to D/C:            Co-evaluation PT/OT/SLP Co-Evaluation/Treatment: Yes            AM-PAC OT "6 Clicks" Daily Activity     Outcome Measure Help from another person eating meals?: A Lot Help from another person taking care of personal grooming?: Total Help from another person toileting, which includes using toliet, bedpan, or urinal?: Total Help from another person bathing (including washing, rinsing, drying)?: Total Help from another person to put on and taking off regular upper body clothing?: Total Help from another person to put on and taking off regular lower body clothing?: Total 6 Click Score: 7   End of Session Equipment Utilized During Treatment: Gait belt Nurse Communication: Mobility status;Other (comment)(Urine incontience in bed)  Activity Tolerance: Patient limited by fatigue;Patient limited by pain Patient left: with call bell/phone within reach;in chair;with chair alarm set;with nursing/sitter in room;with family/visitor present  OT Visit Diagnosis: Unsteadiness on feet (R26.81);Muscle weakness (generalized) (  M62.81)                Time: 7618-4859 OT Time Calculation (min): 40 min Charges:  OT General Charges $OT Visit: 1 Visit OT Evaluation $OT Eval Moderate Complexity: 1 Mod OT Treatments $Self Care/Home Management : 23-37 mins  Jakobie Henslee MSOT, OTR/L Acute Rehab Pager: [phone] Office: [place] 10/25/2018, 1:31 PM

## 2018-10-25 NOTE — NC FL2 (Signed)
[place] MEDICAID FL2 LEVEL OF CARE SCREENING TOOL     IDENTIFICATION  Patient Name: Kelsey PeelingBeverly Willette Birthdate: [birth year] Sex: female Admission Date (Current Location): 10/24/2018  [facility] [place]   Facility and Address:  [facility], [address], [place], [place] [address]      Provider Number: [id number]  Attending Physician Name and Address:  Burns SpainButcher, Elizabeth A, MD  Relative Name and Phone Number:  Dorothe PeaJoseph Klingel, husband, [phone]    Current Level of Care: Hospital Recommended Level of Care: Skilled Nursing Facility Prior Approval Number:    Date Approved/Denied:   PASRR Number: [id number]  Discharge Plan: SNF    Current Diagnoses: Patient Active Problem List   Diagnosis Date Noted  . Pressure injury of skin 10/25/2018  . Obstructive sleep apnea 10/24/2018  . Symptomatic anemia 10/13/2018  . Fracture of femoral neck, right, closed (HCC) 10/10/2018  . Femoral fracture (HCC) 10/09/2018  . Syncope 03/29/2018  . Helicobacter pylori gastritis 02/25/2011  . WEIGHT LOSS 12/31/2010  . ABDOMINAL PAIN, UPPER 12/31/2010  . Dementia (HCC) 08/14/2009  . ASTHMA 08/14/2009  . CONSTIPATION 08/14/2009    Orientation RESPIRATION BLADDER Height & Weight     Self  Normal Incontinent Weight: 132 lb 7.9 oz (60.1 kg) Height:  5\' 2"  (157.5 cm)  BEHAVIORAL SYMPTOMS/MOOD NEUROLOGICAL BOWEL NUTRITION STATUS      Incontinent Diet(see discharge summary)  AMBULATORY STATUS COMMUNICATION OF NEEDS Skin   Extensive Assist Non-Verbally PU Stage and Appropriate Care, Surgical wounds(closed incision on hip)   PU Stage 2 Dressing: (on sacrum with foam dressing)                   Personal Care Assistance Level of Assistance  Bathing, Feeding, Dressing Bathing Assistance: Maximum assistance Feeding assistance: Maximum assistance Dressing Assistance: Maximum assistance     Functional Limitations Info  Sight, Hearing, Speech  Sight Info: Adequate Hearing Info: Adequate Speech Info: Impaired    SPECIAL CARE FACTORS FREQUENCY  PT (By licensed PT), OT (By licensed OT)     PT Frequency: 5x week OT Frequency: 5x week            Contractures Contractures Info: Not present    Additional Factors Info  Code Status, Allergies, Psychotropic Code Status Info: Full Code Allergies Info: No Known Allergies  Psychotropic Info: donepezil (ARICEPT) tablet 10 mg daily at bedtime PO; memantine (NAMENDA) tablet 10 mg 2x daily PO         Current Medications (10/25/2018):  This is the current hospital active medication list Current Facility-Administered Medications  Medication Dose Route Frequency Provider Last Rate Last Dose  . acetaminophen (TYLENOL) tablet 650 mg  650 mg Oral Q6H PRN Santos-Sanchez, Chelsea PrimusIdalys, MD       Or  . acetaminophen (TYLENOL) suppository 650 mg  650 mg Rectal Q6H PRN Santos-Sanchez, Idalys, MD      . aspirin chewable tablet 81 mg  81 mg Oral Daily Santos-Sanchez, Idalys, MD   81 mg at 10/25/18 0839  . atorvastatin (LIPITOR) tablet 10 mg  10 mg Oral QHS Burna CashSantos-Sanchez, Idalys, MD   10 mg at 10/24/18 2112  . calcium carbonate (OS-CAL - dosed in mg of elemental calcium) tablet 500 mg of elemental calcium  500 mg of elemental calcium Oral Q breakfast Santos-Sanchez, Idalys, MD   500 mg of elemental calcium at 10/25/18 0840  . donepezil (ARICEPT) tablet 10 mg  10 mg Oral QHS Burna CashSantos-Sanchez, Idalys, MD  10 mg at 10/24/18 2112  . enoxaparin (LOVENOX) injection 40 mg  40 mg Subcutaneous Q24H Burna CashSantos-Sanchez, Idalys, MD   40 mg at 10/24/18 2118  . folic acid (FOLVITE) tablet 1 mg  1 mg Oral Daily Burna CashSantos-Sanchez, Idalys, MD   1 mg at 10/25/18 0839  . levothyroxine (SYNTHROID, LEVOTHROID) tablet 112 mcg  112 mcg Oral Q0600 Burna CashSantos-Sanchez, Idalys, MD   112 mcg at 10/25/18 [id number]  . memantine (NAMENDA) tablet 10 mg  10 mg Oral BID Burna CashSantos-Sanchez, Idalys, MD   10 mg at 10/25/18 0839  . mometasone-formoterol  (DULERA) 200-5 MCG/ACT inhaler 2 puff  2 puff Inhalation BID Burna CashSantos-Sanchez, Idalys, MD   2 puff at 10/25/18 0854  . multivitamin with minerals tablet 1 tablet  1 tablet Oral Daily Burna CashSantos-Sanchez, Idalys, MD   1 tablet at 10/25/18 0840  . polyethylene glycol (MIRALAX / GLYCOLAX) packet 17 g  17 g Oral Daily PRN Santos-Sanchez, Idalys, MD      . sodium chloride flush (NS) 0.9 % injection 3 mL  3 mL Intravenous Q12H Santos-Sanchez, Idalys, MD   3 mL at 10/25/18 0841  . vitamin B-12 (CYANOCOBALAMIN) tablet 500 mcg  500 mcg Oral Daily Burna CashSantos-Sanchez, Idalys, MD   500 mcg at 10/25/18 [id number]     Discharge Medications: Please see discharge summary for a list of discharge medications.  Relevant Imaging Results:  Relevant Lab Results:   Additional Information SS#[id number]  Shalonda Sachse H Tony[place]se, [place]

## 2018-10-26 NOTE — Progress Notes (Signed)
RT NOTE:  Pt is unable to answer if she wants to wear CPAP tonight. Based on notes, patient has not worn since admission into SNF/Rehab. RT will monitor.

## 2018-10-26 NOTE — Progress Notes (Signed)
   Subjective: Kelsey Norris remains at her mental baseline. She is unable to answer any of my questions. Her husband believes that she is doing well. He does state that she has had limited urinary output into the external urinary catheter. She did however urinate on herself yesterday which was not recorded.   Objective:  Vital signs in last 24 hours: Vitals:   10/26/18 0401 10/26/18 0455 10/26/18 0831 10/26/18 1019  BP: (!) 118/96   101/84  Pulse: 61 (!) 101  71  Resp: 16   16  Temp: (!) 97.4 F (36.3 C)   97.9 F (36.6 C)  TempSrc: Oral   Oral  SpO2: (!) 80% 91% 92% 100%  Weight:      Height:       Physical Exam Vitals signs and nursing note reviewed.  Constitutional:      Appearance: Normal appearance.     Comments: Frail-appearing female lying in bed in no acute distress.  Neurological:     Mental Status: She is alert.     Comments: Alert.  Oriented x0.  Unable to answer any of my questions.  Psychiatric:        Mood and Affect: Mood normal.        Behavior: Behavior normal.    Assessment/Plan:  Principal Problem:   Syncope Active Problems:   Dementia (HCC)   Obstructive sleep apnea   Pressure injury of skin  Kelsey Norris was admitted after an episode of syncope likely due to vasovagal syncope versus orthostatic syncope.  Due to her limited mobility we cannot get orthostatics.  Her blood pressure has however improved since admission and continues to remain stable.  Additionally she remains at her mental baseline per family.  She is awaiting insurance authorization to return to the [facility] rehab facility.  Dispo: Anticipated discharge pending SNF placement.  Synetta Shadow, MD 10/26/2018, 11:03 AM Pager: [phone]

## 2018-10-26 NOTE — Progress Notes (Signed)
CSW contacted Admissions at [facility], continue to await authorization for patient to transfer to SNF.   CSW to follow.  Blenda Nicely, [place] Clinical Social Worker [phone]

## 2018-10-27 DIAGNOSIS — I951 Orthostatic hypotension: Secondary | ICD-10-CM | POA: Diagnosis not present

## 2018-10-27 DIAGNOSIS — G4733 Obstructive sleep apnea (adult) (pediatric): Secondary | ICD-10-CM | POA: Diagnosis not present

## 2018-10-27 DIAGNOSIS — E039 Hypothyroidism, unspecified: Secondary | ICD-10-CM

## 2018-10-27 DIAGNOSIS — D649 Anemia, unspecified: Secondary | ICD-10-CM

## 2018-10-27 DIAGNOSIS — Z79890 Hormone replacement therapy (postmenopausal): Secondary | ICD-10-CM

## 2018-10-27 DIAGNOSIS — Z79899 Other long term (current) drug therapy: Secondary | ICD-10-CM

## 2018-10-27 DIAGNOSIS — Z9989 Dependence on other enabling machines and devices: Secondary | ICD-10-CM

## 2018-10-27 DIAGNOSIS — F0390 Unspecified dementia without behavioral disturbance: Secondary | ICD-10-CM

## 2018-10-27 NOTE — Progress Notes (Signed)
Attempted to call report x3 at [facility] with no answer, left a message to Alexa the receptionist for the nurse to call [phone] for report.

## 2018-10-27 NOTE — Clinical Social Work Note (Signed)
Insurance authorization received and patient will return to [facility] today - Room [employer], transported by ambulance. Mr. Mcadoo at the bedside and informed of receiving insurance authorization. Discharge clinicals transmitted to facility. CSW signing off as no other SW intervention services needed.  Genelle Bal, MSW, LCSW Licensed Clinical Social Worker Clinical Social Work Department [employer] [phone]

## 2018-10-27 NOTE — Progress Notes (Signed)
   Subjective: [name] is accompanied by her husband this morning.  He states that she is doing well and her mental status has not changed.  She has not had any new episodes of syncope since being in the hospital.  She is eating a mechanical soft diet well and is having normal bowel movements and no urinary symptoms.  She will be discharged today to SNF to continue physical therapy for recent hip surgery.  Objective:  Vital signs in last 24 hours: Vitals:   10/26/18 2045 10/27/18 0539 10/27/18 0724 10/27/18 1055  BP: 135/86 (!) 183/76 (!) 158/111 (!) 146/85  Pulse: 63 67 73   Resp: 18 18 18    Temp: 98.5 F (36.9 C) 97.6 F (36.4 C) (!) 97.5 F (36.4 C)   TempSrc: Oral Oral Oral   SpO2: 98%  93%   Weight: 57.1 kg     Height:       Physical Exam Vitals signs and nursing note reviewed.  Constitutional:      Appearance: Normal appearance.  Cardiovascular:     Rate and Rhythm: Normal rate and regular rhythm.  Pulmonary:     Effort: Pulmonary effort is normal.     Breath sounds: Normal breath sounds.  Abdominal:     General: Abdomen is flat. There is no distension.     Tenderness: There is no abdominal tenderness.  Neurological:     Mental Status: She is alert.  Psychiatric:        Mood and Affect: Mood normal.        Behavior: Behavior normal.     Assessment/Plan:  Principal Problem:   Syncope Active Problems:   Dementia (HCC)   Obstructive sleep apnea   Pressure injury of skin  KelseyCrimwas admitted after an episode of syncopelikelydue to vasovagal syncope versus orthostatic syncope. She was treated with IV fluids and placed on a cardiac monitor.  She has not had any new episodes of syncope and no evidence of an acute arrhythmia.  Her vital signs have remained within normal limits.  Additionally she remains at her mental baseline per family.  She will be discharged today to [place] rehab facility.    Dispo: Anticipated discharge today.  Synetta Shadow,  MD 10/27/2018, 3:09 PM Pager: [phone]

## 2020-12-25 IMAGING — DX DG HIP (WITH OR WITHOUT PELVIS) 2-3V*R*
3 series · 3 of 3 positions shown · non-contrast
Comparison: None.

CLINICAL DATA: Altered mental status. Possible fall. Patient status
post fixation of a right subcapital fracture 10/09/2018.

EXAM:
DG HIP (WITH OR WITHOUT PELVIS) 2-3V RIGHT

[t pelvis ap]
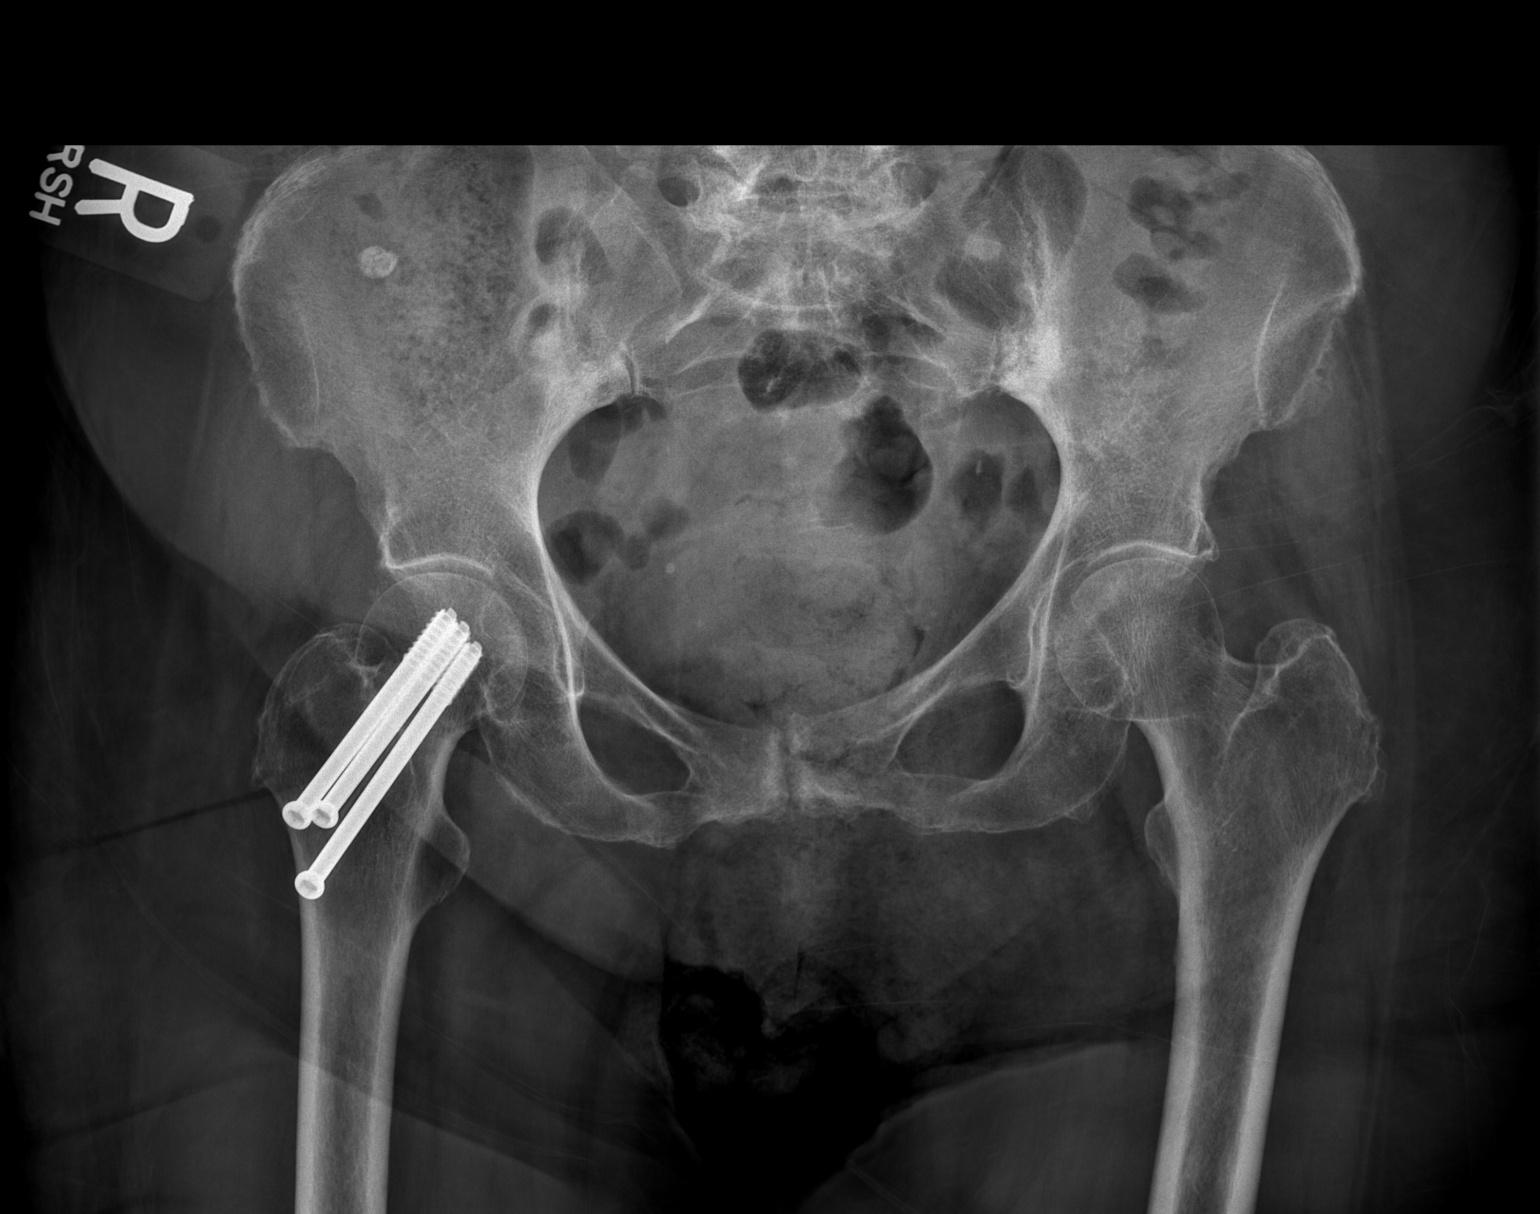

[t hip ap right]
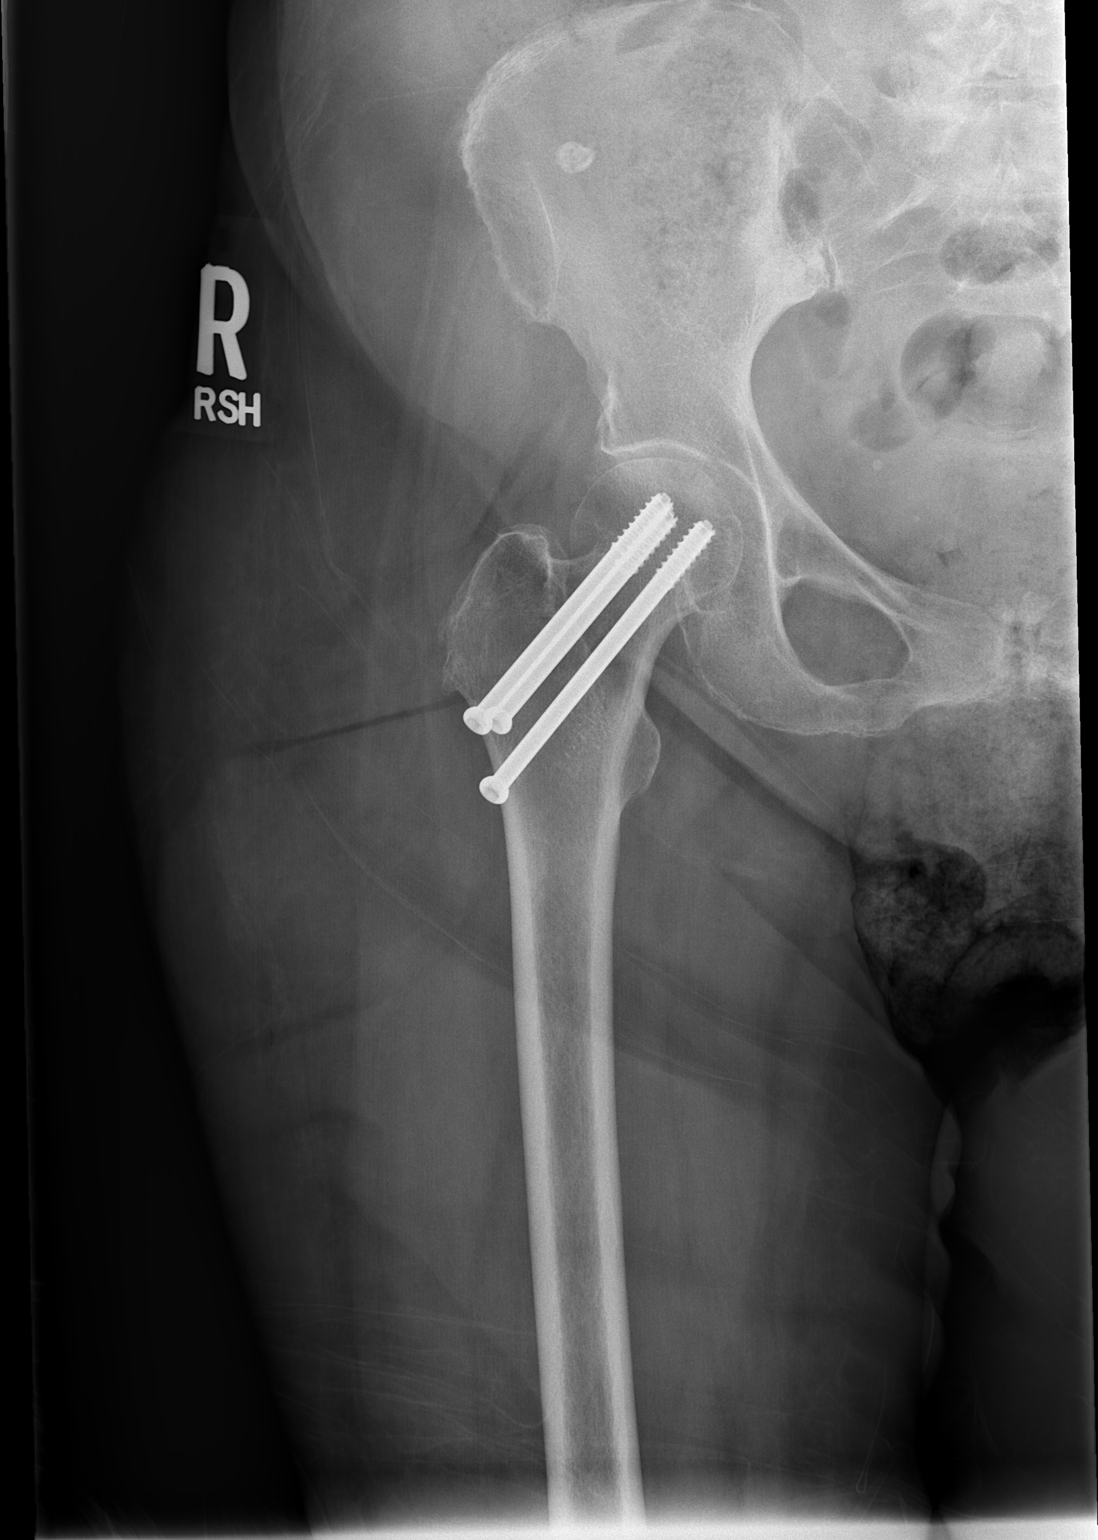

[t hip frog leg right]
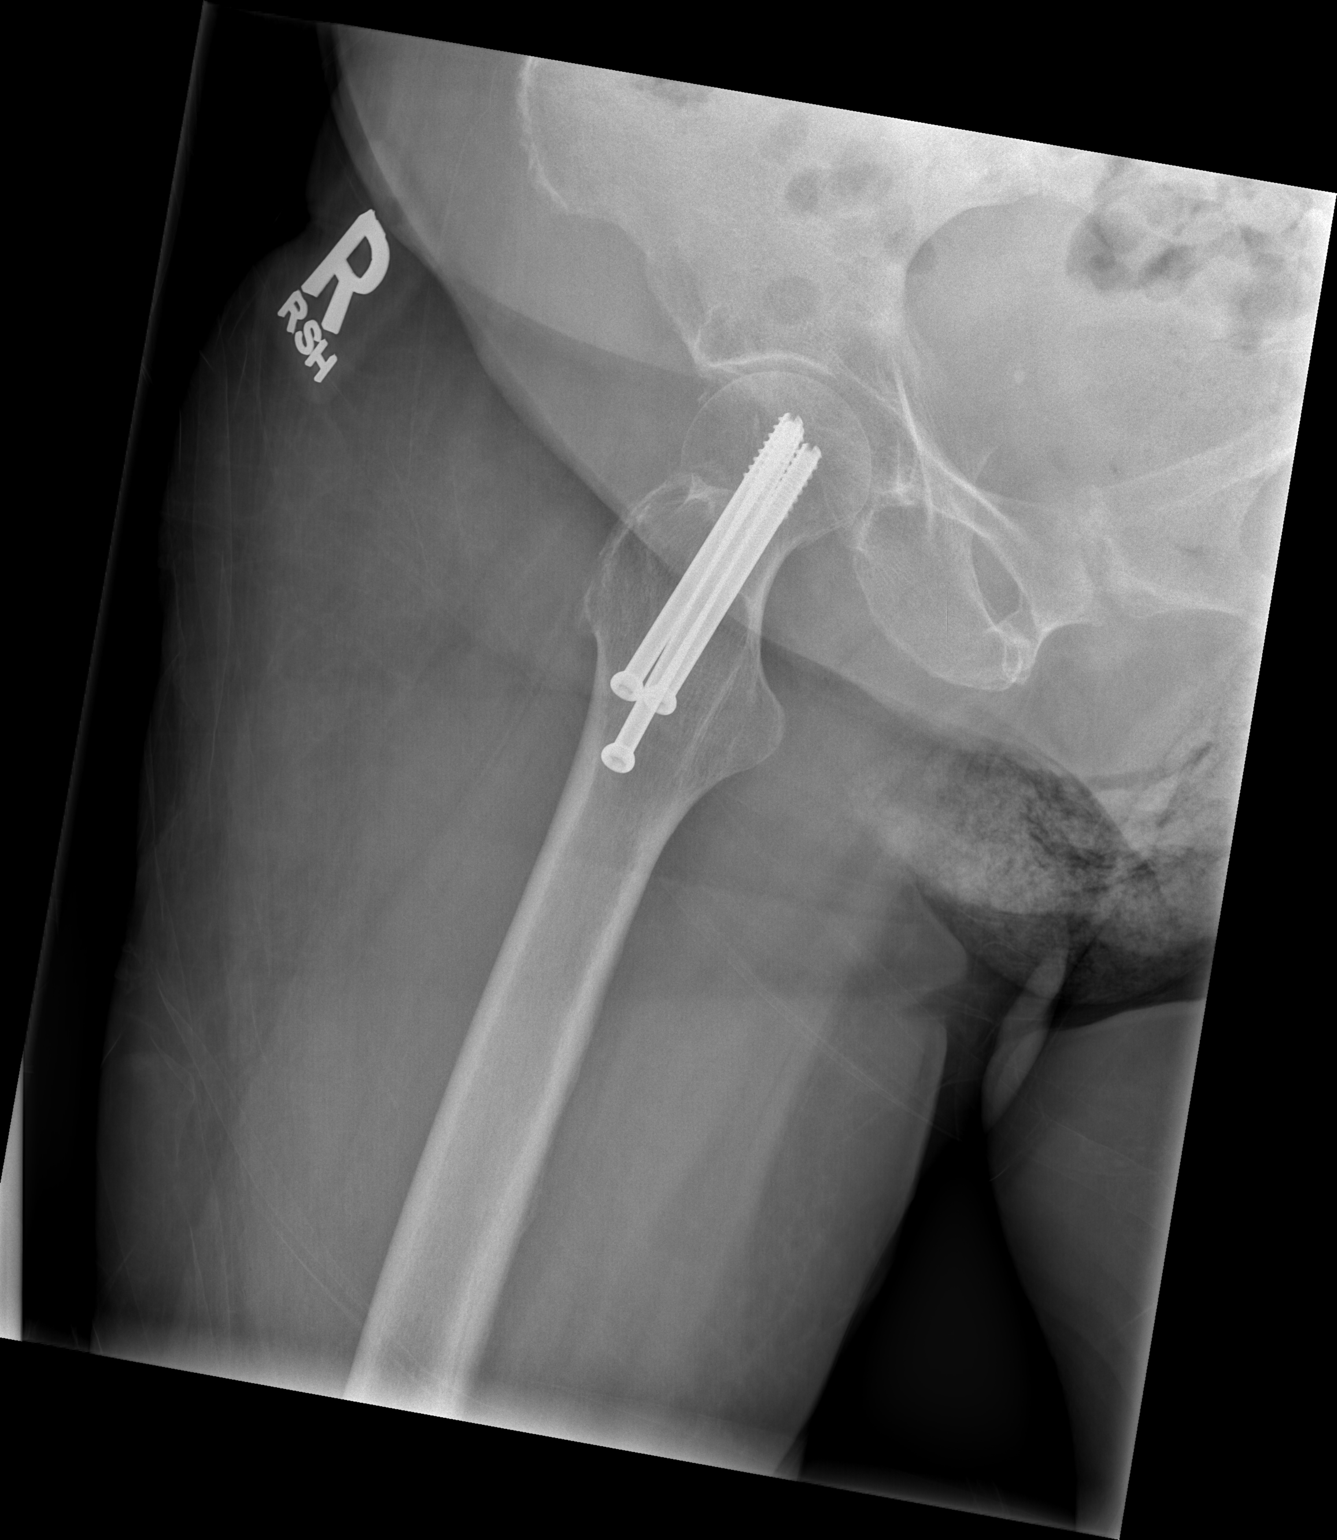

[3 of 3 positions shown; findings below may reference images not displayed]

FINDINGS: 3 screws fixing a right subcapital fracture are intact and unchanged
in position. There is no acute bony or joint abnormality. Lower
lumbar spondylosis noted.
IMPRESSION: No acute finding. Status post fixation of a right subcapital
fracture without evidence of complication.

## 2021-11-26 DEATH — deceased
